# Patient Record
Sex: Female | Born: 1975 | Race: Black or African American | Hispanic: No | Marital: Single | Smoking: Current every day smoker
Health system: Southern US, Community
[De-identification: ages and names within clinical notes are randomized; demographics above are authoritative.]

## PROBLEM LIST (undated history)

## (undated) HISTORY — PX: CHOLECYSTECTOMY: SHX55

---

## 1999-11-01 ENCOUNTER — Emergency Department (HOSPITAL_COMMUNITY): Admission: EM | Admit: 1999-11-01 | Discharge: 1999-11-01 | Payer: Self-pay | Admitting: Emergency Medicine

## 1999-11-02 ENCOUNTER — Encounter: Payer: Self-pay | Admitting: Emergency Medicine

## 1999-11-24 ENCOUNTER — Observation Stay (HOSPITAL_COMMUNITY): Admission: RE | Admit: 1999-11-24 | Discharge: 1999-11-25 | Payer: Self-pay | Admitting: General Surgery

## 1999-11-24 ENCOUNTER — Encounter (INDEPENDENT_AMBULATORY_CARE_PROVIDER_SITE_OTHER): Payer: Self-pay | Admitting: Specialist

## 2000-06-28 ENCOUNTER — Inpatient Hospital Stay (HOSPITAL_COMMUNITY): Admission: AD | Admit: 2000-06-28 | Discharge: 2000-06-28 | Payer: Self-pay | Admitting: *Deleted

## 2000-06-29 ENCOUNTER — Encounter: Payer: Self-pay | Admitting: Obstetrics

## 2000-09-06 ENCOUNTER — Ambulatory Visit (HOSPITAL_COMMUNITY): Admission: RE | Admit: 2000-09-06 | Discharge: 2000-09-06 | Payer: Self-pay | Admitting: *Deleted

## 2000-11-29 ENCOUNTER — Encounter: Payer: Self-pay | Admitting: *Deleted

## 2000-11-29 ENCOUNTER — Ambulatory Visit (HOSPITAL_COMMUNITY): Admission: RE | Admit: 2000-11-29 | Discharge: 2000-11-29 | Payer: Self-pay | Admitting: *Deleted

## 2001-01-15 ENCOUNTER — Ambulatory Visit (HOSPITAL_COMMUNITY): Admission: RE | Admit: 2001-01-15 | Discharge: 2001-01-15 | Payer: Self-pay | Admitting: *Deleted

## 2001-01-25 ENCOUNTER — Inpatient Hospital Stay (HOSPITAL_COMMUNITY): Admission: AD | Admit: 2001-01-25 | Discharge: 2001-01-25 | Payer: Self-pay | Admitting: Obstetrics & Gynecology

## 2001-01-31 ENCOUNTER — Encounter (INDEPENDENT_AMBULATORY_CARE_PROVIDER_SITE_OTHER): Payer: Self-pay

## 2001-01-31 ENCOUNTER — Inpatient Hospital Stay (HOSPITAL_COMMUNITY): Admission: AD | Admit: 2001-01-31 | Discharge: 2001-02-04 | Payer: Self-pay | Admitting: *Deleted

## 2001-02-07 ENCOUNTER — Inpatient Hospital Stay (HOSPITAL_COMMUNITY): Admission: AD | Admit: 2001-02-07 | Discharge: 2001-02-07 | Payer: Self-pay | Admitting: Obstetrics

## 2002-11-19 ENCOUNTER — Encounter: Payer: Self-pay | Admitting: Emergency Medicine

## 2002-11-19 ENCOUNTER — Emergency Department (HOSPITAL_COMMUNITY): Admission: EM | Admit: 2002-11-19 | Discharge: 2002-11-19 | Payer: Self-pay | Admitting: Emergency Medicine

## 2005-03-21 ENCOUNTER — Ambulatory Visit: Payer: Self-pay | Admitting: Internal Medicine

## 2005-03-22 ENCOUNTER — Ambulatory Visit: Payer: Self-pay | Admitting: Internal Medicine

## 2005-06-13 ENCOUNTER — Ambulatory Visit: Payer: Self-pay | Admitting: Internal Medicine

## 2005-09-05 ENCOUNTER — Ambulatory Visit: Payer: Self-pay | Admitting: Internal Medicine

## 2005-09-07 ENCOUNTER — Emergency Department (HOSPITAL_COMMUNITY): Admission: EM | Admit: 2005-09-07 | Discharge: 2005-09-07 | Payer: Self-pay | Admitting: Emergency Medicine

## 2005-10-21 ENCOUNTER — Ambulatory Visit: Payer: Self-pay | Admitting: Family Medicine

## 2005-11-28 ENCOUNTER — Ambulatory Visit: Payer: Self-pay | Admitting: Family Medicine

## 2005-12-29 ENCOUNTER — Encounter: Payer: Self-pay | Admitting: Family Medicine

## 2005-12-29 ENCOUNTER — Ambulatory Visit: Payer: Self-pay | Admitting: Family Medicine

## 2006-02-20 ENCOUNTER — Ambulatory Visit: Payer: Self-pay | Admitting: Family Medicine

## 2006-05-15 ENCOUNTER — Ambulatory Visit: Payer: Self-pay | Admitting: Family Medicine

## 2006-08-07 ENCOUNTER — Ambulatory Visit: Payer: Self-pay | Admitting: Family Medicine

## 2006-09-29 ENCOUNTER — Emergency Department (HOSPITAL_COMMUNITY): Admission: EM | Admit: 2006-09-29 | Discharge: 2006-09-29 | Payer: Self-pay | Admitting: Emergency Medicine

## 2007-04-29 ENCOUNTER — Emergency Department (HOSPITAL_COMMUNITY): Admission: EM | Admit: 2007-04-29 | Discharge: 2007-04-29 | Payer: Self-pay | Admitting: Emergency Medicine

## 2011-01-14 NOTE — Op Note (Signed)
[facility]  Patient:    Brittany Woods, Brittany Woods                   MRN: [id number] Proc. Date: 02/01/01 Adm. Date:  [id number] Attending:  Michaelle Copas                           Operative Report  PREOPERATIVE DIAGNOSIS:       Arrest of descent.  POSTOPERATIVE DIAGNOSIS:      Arrest of descent.  OPERATION:                    Primary low transverse cesarean section.  SURGEON:                      Charles A. Clearance Coots, M.D.  ASSISTANT:                    Marlinda Mike, CNM  ANESTHESIA:                   Epidural.  ESTIMATED BLOOD LOSS:         1000 ml.  IV FLUIDS:                    1300 ml.  URINE OUT:                    300 ml, clear.  COMPLICATIONS:                None.  DRAINS:                       Foley to gravity.  FINDINGS:                     Viable female at 0203, Apgars 9 at 1 minute, 9 at 5 minutes, weight 8 pounds 7 ounces, normal uterus, ovaries, and fallopian tubes.  DESCRIPTION OF PROCEDURE:     The patient was brought to the operating room and, after satisfactory redosing of the epidural, the abdomen was prepped and draped in the usual sterile fashion.  Pfannenstiel skin incision was made with the scalpel that was deepened down to the fascia with the scalpel.  The fascia was nicked in the midline, and the fascial incision was extended to the left and to the right with curved Mayo scissors.  The superior and inferior fascial edges were taken off the rectus muscles with blunt and sharp dissection.  The rectus muscle was then sharply and bluntly divided in the midline superiorly and inferiorly, being careful to avoid the urinary bladder.  The peritoneum was entered digitally and was digitally extended to the left and to the right. The bladder blade was positioned, and the vesicouterine fold of the peritoneum above the reflection of the urinary bladder was grasped with forceps and was incised and undermined with Metzenbaum  scissors.  The incision was extended to the left and to the right with Metzenbaum scissors.  The bladder flap was bluntly developed, and the bladder blade was repositioned in front of the urinary bladder placing it well out of the operative field.  The uterus was entered in the lower uterine segment transversely with the scalpel.  The uterus incision was extended to the left and to the right with bandage scissors.  The vertex was noted to be left occiput posterior, and occiput was  then rotated into the incision, and there was still some difficulty flexing the occiput through the incision, and vacuum assistance was applied to the occiput with the Mityvac suction cup applied to the occiput, and the delivery was then completed with the aid of fundal pressure from the assistant and vacuum extraction.  The infants mouth and nose were suctioned with the suction bulb, and the delivery was then completed with the aid of fundal pressure from the assistant.  The umbilical cord was clamped and cut, and the infant was handed off to the nursery staff.  Cord blood was obtained, and the placenta was spontaneously expelled from the uterine cavity intact.  The edges of the uterine incision were grasped with ring forceps, and the uterus was closed with a continuous interlocking suture of 0 Monocryl from each corner to the center.  Hemostasis was excellent.  The pelvic cavity was then thoroughly irrigated with warm saline solution, and the closure of the uterus was again observed for hemostasis, and there was no active bleeding noted.  The abdomen was then closed as follows.  The peritoneum was closed with continuous suture of 2-0 Monocryl.  The fascia was closed with continuous suture of 0 Panacryl from each corner to the center.  Subcutaneous tissue was thoroughly irrigated with warm saline solution, and all areas of subcutaneous bleeding were coagulated with the Bovie.  Subcutaneous tissue was then  approximated with a continuous suture of 0 plain catgut.  Skin was approximated with stainless steel staples.  Sterile pressure bandage was applied to the incision closure. Surgical technician indicated that all needle, sponge, and instrument counts were correct.  The patient tolerated the procedure well and was transported to the recovery room in satisfactory condition. DD:  02/01/01 TD:  02/01/01 Job: [id number]

## 2011-01-14 NOTE — Discharge Summary (Signed)
[facility]  Patient:    Brittany Woods, Brittany Woods                   MRN: [id number] Adm. Date:  [id number] Disc. Date: [id number] Attending:  Tammi Sou Dictator:   Santiago Bumpers, M.D.                           Discharge Summary  ADMISSION DIAGNOSES:          A 38 week 4 day intrauterine pregnancy in early active labor.  DISCHARGE DIAGNOSES:          1. A 38 week 4 day intrauterine pregnancy in                                  early active labor.                               2. Chorioamnionitis.                               3. Cesarean delivery of a viable female infant.  DISCHARGE MEDICATIONS:        1. Percocet one to two tablets q.4-6h. p.r.n.                                  for pain.                               2. Ibuprofen one tablet q.6h. p.r.n. for pain.                               3. Prenatal vitamins one tablet q.d. for six                                  weeks (patient was given Depo-Provera IM                                  prior to discharge for contraception).  CONSULTS:                     None.  PROCEDURE:                    Low transverse cesarean section on February 01, 2001.  INDICATIONS:                  Arrest of descent.  FINDINGS:                     Viable female infant with Apgars of 9 at one minute and 9 at five minutes.  Weight 8 pounds 7 ounces.  Normal uterus, ovaries, and tubes were found.  Estimated blood loss was 1 L.  No complications.  HISTORY AND PHYSICAL:         For complete H&P please see resident H&P in chart.  Briefly, this was a 35 year old woman G2, P1-0-0-1 who  presented at 38 weeks 4 days gestation in early active labor.  She was afebrile.  Vital signs were stable.  Physical examination was within normal limits.  Cervix examination was 5 cm, 50%, -2, vertex presentation.  Fetal heart tracing was reactive and reassuring.  The patient was admitted for trial of labor and hospital course is as  follows.  HOSPITAL COURSE:              Patient continued to have intense uterine contractions, but repeated cervical examinations did not show a significant change in dilation or effacement.  She had internal uterine monitoring placed and was begun on Pitocin.  Even with adequate [place] units cervical examination did not change.  She did develop a low grade intrapartum fever and was begun on Unasyn.  Due to arrest of active phase of labor and low grade fever it was determined that she needed to be taken for low transverse cesarean delivery.  This was done and there were no complications (please see procedure part of dictation).  The patient tolerated the procedure well and postoperative course was uncomplicated.  She was afebrile.  Fundus was appropriately tender and involuted below the umbilicus.  Incision was clean, dry, and intact.  White blood cell count postoperatively was 18.1, hemoglobin 11.5.  She was continued on postoperative IV antibiotics until postoperative day #3.  On postoperative day #3 she was discharged to home with instructions to [facility].  She was also instructed to return to [facility] in the next several days for staple removal. DD:  03/15/01 TD:  03/15/01 Job: [id number]

## 2011-03-10 ENCOUNTER — Inpatient Hospital Stay (INDEPENDENT_AMBULATORY_CARE_PROVIDER_SITE_OTHER)
Admission: RE | Admit: 2011-03-10 | Discharge: 2011-03-10 | Disposition: A | Discharge status: Home / Self Care | Payer: Medicaid Other | Source: Ambulatory Visit | Attending: Family Medicine | Admitting: Family Medicine

## 2011-03-10 DIAGNOSIS — IMO0001 Reserved for inherently not codable concepts without codable children: Secondary | ICD-10-CM

## 2011-06-10 LAB — URINALYSIS, ROUTINE W REFLEX MICROSCOPIC
Bilirubin Urine: NEGATIVE
Glucose, UA: NEGATIVE
Ketones, ur: NEGATIVE
Nitrite: POSITIVE — AB
Specific Gravity, Urine: 1.012
pH: 6

## 2011-06-10 LAB — URINE MICROSCOPIC-ADD ON

## 2011-09-21 ENCOUNTER — Encounter (HOSPITAL_COMMUNITY): Payer: Self-pay | Admitting: Emergency Medicine

## 2011-09-21 ENCOUNTER — Emergency Department (INDEPENDENT_AMBULATORY_CARE_PROVIDER_SITE_OTHER)
Admission: EM | Admit: 2011-09-21 | Discharge: 2011-09-21 | Disposition: A | Discharge status: Home / Self Care | Payer: Self-pay | Source: Home / Self Care | Attending: Family Medicine | Admitting: Family Medicine

## 2011-09-21 DIAGNOSIS — L02419 Cutaneous abscess of limb, unspecified: Secondary | ICD-10-CM

## 2011-09-21 MED ORDER — PREDNISONE 20 MG PO TABS: 40.0000 mg | ORAL_TABLET | Freq: Every day | ORAL | Status: AC

## 2011-09-21 MED ORDER — CEPHALEXIN 500 MG PO CAPS: 500.0000 mg | ORAL_CAPSULE | Freq: Four times a day (QID) | ORAL | Status: AC

## 2011-09-21 NOTE — ED Notes (Signed)
Pt left leg started itching really bad starting last night. She states it started off looking like a pimple but it got progressively more itchy and swollen. She states this happened to her arm once before.

## 2011-09-21 NOTE — ED Provider Notes (Signed)
History     CSN: [id number]  Arrival date & time 09/21/11  1610   First MD Initiated Contact with Patient 09/21/11 1723      Chief Complaint  Patient presents with  . Pruritis    (Consider location/radiation/quality/duration/timing/severity/associated sxs/prior treatment) HPI Comments: Brittany Woods presents for examination of itching, redness, warmth, and swelling of her LEFT lower leg. She denies any specific injury, though she does report a "pimple" in the area. She remembers scratching it. She also reports a similar hx in her LEFT upper arm in the past.   Patient is a 36 y.o. female presenting with rash. The history is provided by the patient.  Rash  This is a new problem. The current episode started yesterday. The problem has not changed since onset.The problem is associated with an unknown factor. There has been no fever. The rash is present on the left lower leg. The pain is mild. Associated symptoms include itching. She has tried nothing for the symptoms.    History reviewed. No pertinent past medical history.  History reviewed. No pertinent past surgical history.  History reviewed. No pertinent family history.  History  Substance Use Topics  . Smoking status: Not on file  . Smokeless tobacco: Not on file  . Alcohol Use: Not on file    OB History    Grav Para Term Preterm Abortions TAB SAB Ect Mult Living                  Review of Systems  Constitutional: Negative.   HENT: Negative.   Eyes: Negative.   Respiratory: Negative.   Cardiovascular: Negative.   Gastrointestinal: Negative.   Genitourinary: Negative.   Musculoskeletal: Negative.   Skin: Positive for itching and rash.       Itching and warmth  Neurological: Negative.     Allergies  Review of patient's allergies indicates no known allergies.  Home Medications   Current Outpatient Rx  Name Route Sig Dispense Refill  . CEPHALEXIN 500 MG PO CAPS Oral Take 1 capsule (500 mg total) by mouth 4 (four)  times daily. 28 capsule 0  . PREDNISONE 20 MG PO TABS Oral Take 2 tablets (40 mg total) by mouth daily. 10 tablet 0    BP 138/81  Pulse 99  Temp(Src) 100 F (37.8 C) (Oral)  Resp 20  SpO2 100%  LMP 09/18/2011  Physical Exam  Nursing note and vitals reviewed. Constitutional: She is oriented to person, place, and time. She appears well-developed and well-nourished.  HENT:  Head: Normocephalic and atraumatic.  Eyes: EOM are normal.  Neck: Normal range of motion.  Pulmonary/Chest: Effort normal.  Musculoskeletal: Normal range of motion.       Left lower leg: She exhibits tenderness and swelling.       Legs: Neurological: She is alert and oriented to person, place, and time.  Skin: Skin is warm and dry.  Psychiatric: Her behavior is normal.    ED Course  Procedures (including critical care time)  Labs Reviewed - No data to display No results found.   1. Cellulitis and abscess of leg       MDM  Will treat for cellulitis and inflammation with antibiotics and prednisone; return in 48 hours for evaluation        Richardo Priest, MD 09/21/11 1859

## 2012-09-03 ENCOUNTER — Encounter (HOSPITAL_COMMUNITY): Payer: Self-pay | Admitting: *Deleted

## 2012-09-03 ENCOUNTER — Emergency Department (HOSPITAL_COMMUNITY)
Admission: EM | Admit: 2012-09-03 | Discharge: 2012-09-04 | Disposition: A | Discharge status: Home / Self Care | Payer: Medicaid Other | Attending: Emergency Medicine | Admitting: Emergency Medicine

## 2012-09-03 DIAGNOSIS — T63391A Toxic effect of venom of other spider, accidental (unintentional), initial encounter: Secondary | ICD-10-CM | POA: Insufficient documentation

## 2012-09-03 DIAGNOSIS — W57XXXA Bitten or stung by nonvenomous insect and other nonvenomous arthropods, initial encounter: Secondary | ICD-10-CM

## 2012-09-03 DIAGNOSIS — F17200 Nicotine dependence, unspecified, uncomplicated: Secondary | ICD-10-CM | POA: Insufficient documentation

## 2012-09-03 DIAGNOSIS — IMO0002 Reserved for concepts with insufficient information to code with codable children: Secondary | ICD-10-CM | POA: Insufficient documentation

## 2012-09-03 DIAGNOSIS — Y939 Activity, unspecified: Secondary | ICD-10-CM | POA: Insufficient documentation

## 2012-09-03 DIAGNOSIS — Y929 Unspecified place or not applicable: Secondary | ICD-10-CM | POA: Insufficient documentation

## 2012-09-03 DIAGNOSIS — L299 Pruritus, unspecified: Secondary | ICD-10-CM | POA: Insufficient documentation

## 2012-09-03 MED ORDER — CEPHALEXIN 500 MG PO CAPS: 500.0000 mg | ORAL_CAPSULE | Freq: Four times a day (QID) | ORAL | Status: DC

## 2012-09-03 MED ORDER — DEXAMETHASONE SODIUM PHOSPHATE 10 MG/ML IJ SOLN
10.0000 mg | Freq: Once | INTRAMUSCULAR | Status: AC
  Administered 2012-09-03: 10 mg via INTRAMUSCULAR
  Filled 2012-09-03: qty 1

## 2012-09-03 NOTE — ED Notes (Signed)
Pt states that her left eye started swelling yesterday. Pt states that she believes she was bite by something. Pt used warm rag to reduce swelling last night but throughout the day swelling continued. tp states also has new raised areas on her left forearm. Pt denies changes to daily routine.

## 2012-09-03 NOTE — ED Provider Notes (Signed)
History     CSN: [id number]  Arrival date & time 09/03/12  2223   First MD Initiated Contact with Patient 09/03/12 2305      Chief Complaint  Patient presents with  . Facial Swelling   HPI  History provided by the patient. Patient is a 37 year old female with no significant PMH who presents with concerns for insect bites and swelling to the face and arm. Patient states that symptoms first began yesterday with small areas of swelling and itching. Patient used some warm and cool rags over the areas with improvement. Symptoms however returned throughout the day today with some increased swelling and continued itching. Patient does admit to rubbing the areas regularly though she has been trying not to. Areas are located to the left eyebrow and left hand arm and elbow areas. Patient states that she thinks that she has had spider bites and has problems with small spiders in the house. She reports similar symptoms previously. She denies any other new environmental exposures. Denies any known allergic reactions. Denies any diffuse rash. Denies any difficulty breathing swallowing. Denies any fever, chills or sweats. Patient has not used any medications for this.    History reviewed. No pertinent past medical history.  Past Surgical History  Procedure Date  . Cholecystectomy   . Cesarean section 2002    History reviewed. No pertinent family history.  History  Substance Use Topics  . Smoking status: Current Every Day Smoker  . Smokeless tobacco: Not on file  . Alcohol Use: Yes    OB History    Grav Para Term Preterm Abortions TAB SAB Ect Mult Living                  Review of Systems  All other systems reviewed and are negative.    Allergies  Review of patient's allergies indicates no known allergies.  Home Medications  No current outpatient prescriptions on file.  BP 135/92  Pulse 95  Temp 98.5 F (36.9 C) (Oral)  Resp 16  SpO2 100%  Physical Exam  Nursing note and  vitals reviewed. Constitutional: She is oriented to person, place, and time. She appears well-developed and well-nourished. No distress.  HENT:  Head: Normocephalic.  Mouth/Throat: Oropharynx is clear and moist.  Neck: Normal range of motion. Neck supple.  Cardiovascular: Normal rate and regular rhythm.   Pulmonary/Chest: Effort normal and breath sounds normal. No stridor. No respiratory distress. She has no wheezes. She has no rales.  Neurological: She is alert and oriented to person, place, and time.  Skin: Skin is warm and dry. No rash noted.       Papular lesion to left eyebrow with surrounding erythema and edema extending into the upper eyelid. Similar papular lesions to left hand, forearm and elbow area with surrounding erythema. No other rash on skin.  Psychiatric: She has a normal mood and affect. Her behavior is normal.    ED Course  Procedures      1. Insect bite       MDM  11:20 PM patient seen and evaluated. Patient well-appearing in no acute distress. Symptoms consistent with insight bites and local surrounding reaction. At this time no concerns for secondary saline this infection.        Angus Seller, [place] 09/04/12 [phone]

## 2012-09-03 NOTE — ED Notes (Signed)
The pts lt eyelid is less swollen

## 2012-09-04 NOTE — ED Provider Notes (Signed)
Medical screening examination/treatment/procedure(s) were performed by non-physician practitioner and as supervising physician I was immediately available for consultation/collaboration.  Olivia Mackie, MD 09/04/12 [phone]

## 2013-12-14 ENCOUNTER — Other Ambulatory Visit (HOSPITAL_COMMUNITY)
Admission: RE | Admit: 2013-12-14 | Discharge: 2013-12-14 | Disposition: A | Discharge status: Home / Self Care | Payer: BC Managed Care – PPO | Source: Ambulatory Visit | Attending: Emergency Medicine | Admitting: Emergency Medicine

## 2013-12-14 ENCOUNTER — Encounter (HOSPITAL_COMMUNITY): Payer: Self-pay | Admitting: Emergency Medicine

## 2013-12-14 ENCOUNTER — Emergency Department (INDEPENDENT_AMBULATORY_CARE_PROVIDER_SITE_OTHER)
Admission: EM | Admit: 2013-12-14 | Discharge: 2013-12-14 | Disposition: A | Discharge status: Home / Self Care | Payer: BC Managed Care – PPO | Source: Home / Self Care | Attending: Emergency Medicine | Admitting: Emergency Medicine

## 2013-12-14 DIAGNOSIS — Z113 Encounter for screening for infections with a predominantly sexual mode of transmission: Secondary | ICD-10-CM | POA: Insufficient documentation

## 2013-12-14 DIAGNOSIS — N760 Acute vaginitis: Secondary | ICD-10-CM | POA: Insufficient documentation

## 2013-12-14 LAB — POCT URINALYSIS DIP (DEVICE)
Bilirubin Urine: NEGATIVE
Glucose, UA: NEGATIVE mg/dL
Ketones, ur: NEGATIVE mg/dL
Nitrite: POSITIVE — AB
PROTEIN: NEGATIVE mg/dL
Specific Gravity, Urine: 1.02 (ref 1.005–1.030)
UROBILINOGEN UA: 1 mg/dL (ref 0.0–1.0)
pH: 7 (ref 5.0–8.0)

## 2013-12-14 LAB — POCT PREGNANCY, URINE: PREG TEST UR: NEGATIVE

## 2013-12-14 MED ORDER — METRONIDAZOLE 500 MG PO TABS: 500.0000 mg | ORAL_TABLET | Freq: Two times a day (BID) | ORAL | Status: DC

## 2013-12-14 MED ORDER — FLUCONAZOLE 150 MG PO TABS: 150.0000 mg | ORAL_TABLET | Freq: Once | ORAL | Status: DC

## 2013-12-14 NOTE — Discharge Instructions (Signed)
To restore the normal balance of "good bacteria" in your system.  Take a probiotic once daily.  These can be gotten over the counter at the drug store without a prescription and come under various brand names such as Culturelle, Align, Florastore, and [employer].  The best thing to do is to ask your pharmacist to recommend a good probiotic that is not too expensive.     Bacterial Vaginosis Bacterial vaginosis is a vaginal infection that occurs when the normal balance of bacteria in the vagina is disrupted. It results from an overgrowth of certain bacteria. This is the most common vaginal infection in women of childbearing age. Treatment is important to prevent complications, especially in pregnant women, as it can cause a premature delivery. CAUSES  Bacterial vaginosis is caused by an increase in harmful bacteria that are normally present in smaller amounts in the vagina. Several different kinds of bacteria can cause bacterial vaginosis. However, the reason that the condition develops is not fully understood. RISK FACTORS Certain activities or behaviors can put you at an increased risk of developing bacterial vaginosis, including:  Having a new sex partner or multiple sex partners.  Douching.  Using an intrauterine device (IUD) for contraception. Women do not get bacterial vaginosis from toilet seats, bedding, swimming pools, or contact with objects around them. SIGNS AND SYMPTOMS  Some women with bacterial vaginosis have no signs or symptoms. Common symptoms include:  Grey vaginal discharge.  A fishlike odor with discharge, especially after sexual intercourse.  Itching or burning of the vagina and vulva.  Burning or pain with urination. DIAGNOSIS  Your health care provider will take a medical history and examine the vagina for signs of bacterial vaginosis. A sample of vaginal fluid may be taken. Your health care provider will look at this sample under a microscope to check for bacteria and  abnormal cells. A vaginal pH test may also be done.  TREATMENT  Bacterial vaginosis may be treated with antibiotic medicines. These may be given in the form of a pill or a vaginal cream. A second round of antibiotics may be prescribed if the condition comes back after treatment.  HOME CARE INSTRUCTIONS   Only take over-the-counter or prescription medicines as directed by your health care provider.  If antibiotic medicine was prescribed, take it as directed. Make sure you finish it even if you start to feel better.  Do not have sex until treatment is completed.  Tell all sexual partners that you have a vaginal infection. They should see their health care provider and be treated if they have problems, such as a mild rash or itching.  Practice safe sex by using condoms and only having one sex partner. SEEK MEDICAL CARE IF:   Your symptoms are not improving after 3 days of treatment.  You have increased discharge or pain.  You have a fever. MAKE SURE YOU:   Understand these instructions.  Will watch your condition.  Will get help right away if you are not doing well or get worse. FOR MORE INFORMATION  Centers for Disease Control and Prevention, Division of STD Prevention: SolutionApps.co.zawww.cdc.gov/std American Sexual Health Association (ASHA): www.ashastd.org  Document Released: 08/15/2005 Document Revised: 06/05/2013 Document Reviewed: 03/27/2013 [facility] Patient Information 2014 [place], [place]. Candidal Vulvovaginitis Candidal vulvovaginitis is an infection of the vagina and vulva. The vulva is the skin around the opening of the vagina. This may cause itching and discomfort in and around the vagina.  HOME CARE  Only take medicine as told by  your doctor.  Do not have sex (intercourse) until the infection is healed or as told by your doctor.  Practice safe sex.  Tell your sex partner about your infection.  Do not douche or use tampons.  Wear cotton underwear. Do not wear tight pants or  panty hose.  Eat yogurt. This may help treat and prevent yeast infections. GET HELP RIGHT AWAY IF:   You have a fever.  Your problems get worse during treatment or do not get better in 3 days.  You have discomfort, irritation, or itching in your vagina or vulva area.  You have pain after sex.  You start to get belly (abdominal) pain. MAKE SURE YOU:  Understand these instructions.  Will watch your condition.  Will get help right away if you are not doing well or get worse. Document Released: 11/11/2008 Document Revised: 11/07/2011 Document Reviewed: 11/11/2008 [facility] Patient Information 2014 [place], [place].

## 2013-12-14 NOTE — ED Provider Notes (Signed)
Chief Complaint   Chief Complaint  Patient presents with  . Vaginal Itching    History of Present Illness   Brittany Woods is a 38 year old female who has had a two-day history of vaginal irritation. She denies any discharge, odor, or itching. She has had no vaginal, vulvar, or pelvic pain. She denies any urinary symptoms. No fever, chills, nausea, or vomiting. Her menses have been regular. She is on Depo-Provera. She has a history of atrial vaginosis in the past.  Review of Systems   Other than as noted above, the patient denies any of the following symptoms: Systemic:  No fever or chills GI:  No abdominal pain, nausea, vomiting, diarrhea, constipation, melena or hematochezia. GU:  No dysuria, frequency, urgency, hematuria, vaginal discharge, itching, or abnormal vaginal bleeding.  PMFSH   Past medical history, family history, social history, meds, and allergies were reviewed.    Physical Examination    Vital signs:  BP 146/85  Pulse 85  Temp(Src) 98.4 F (36.9 C) (Oral)  Resp 17  SpO2 100% General:  Alert, oriented and in no distress. Lungs:  Breath sounds clear and equal bilaterally.  No wheezes, rales or rhonchi. Heart:  Regular rhythm.  No gallops or murmers. Abdomen:  Soft, flat and non-distended.  No organomegaly or mass.  No tenderness, guarding or rebound.  Bowel sounds normally active. Pelvic exam:  Normal external genitalia. No ulcerations or lesions. Vaginal and cervical mucosa were normal. There was a moderate amount of malodorous vaginal discharge. No pain on cervical motion. Uterus was normal in size and shape and nontender. No adnexal masses or tenderness.  DNA probes for gonorrhea, Chlamydia, Trichomonas, Gardnerella, Candida were obtained. Skin:  Clear, warm and dry.  Labs   Results for orders placed during the hospital encounter of 12/14/13  POCT URINALYSIS DIP (DEVICE)      Result Value Ref Range   Glucose, UA NEGATIVE  NEGATIVE mg/dL   Bilirubin  Urine NEGATIVE  NEGATIVE   Ketones, ur NEGATIVE  NEGATIVE mg/dL   Specific Gravity, Urine 1.020  1.005 - 1.030   Hgb urine dipstick TRACE (*) NEGATIVE   pH 7.0  5.0 - 8.0   Protein, ur NEGATIVE  NEGATIVE mg/dL   Urobilinogen, UA 1.0  0.0 - 1.0 mg/dL   Nitrite POSITIVE (*) NEGATIVE   Leukocytes, UA SMALL (*) NEGATIVE  POCT PREGNANCY, URINE      Result Value Ref Range   Preg Test, Ur NEGATIVE  NEGATIVE    Assessment   The encounter diagnosis was Vaginitis.  Probably Candida or bacterial vaginosis.       Plan    1.  Meds:  The following meds were prescribed:   Discharge Medication List as of 12/14/2013  4:00 PM    START taking these medications   Details  fluconazole (DIFLUCAN) 150 MG tablet Take 1 tablet (150 mg total) by mouth once., Starting 12/14/2013, Normal    metroNIDAZOLE (FLAGYL) 500 MG tablet Take 1 tablet (500 mg total) by mouth 2 (two) times daily., Starting 12/14/2013, Until Discontinued, Normal        2.  Patient Education/Counseling:  The patient was given appropriate handouts, self care instructions, and instructed in symptomatic relief.  Suggested probiotics for prevention of both Candida and bacterial vaginosis.  3.  Follow up:  The patient was told to follow up here if no better in 3 to 4 days, or sooner if becoming worse in any way, and given some red flag symptoms such as  worsening pain, fever, persistent vomiting, or heavy vaginal bleeding which would prompt immediate return.       Reuben Likesavid C Lavontay Kirk, MD 12/14/13 2039

## 2013-12-14 NOTE — ED Notes (Signed)
Pt  Reports  Symptoms  Of  Vaginal  Irritation      Discomfort  That  She  Noticed  Last  Pm   denys  Any  Discharge /  Bleeding

## 2013-12-16 LAB — CERVICOVAGINAL ANCILLARY ONLY
Chlamydia: NEGATIVE
NEISSERIA GONORRHEA: NEGATIVE
Wet Prep (BD Affirm): NEGATIVE
Wet Prep (BD Affirm): NEGATIVE
Wet Prep (BD Affirm): POSITIVE — AB

## 2013-12-17 NOTE — ED Notes (Signed)
GC/Chlamydia neg., Affirm: Candida and Trich neg. Gardnerella pos.  Pt. adequately treated with Flagyl. Desiree LucySuzanne M [facility] 12/17/2013

## 2015-04-19 ENCOUNTER — Other Ambulatory Visit (HOSPITAL_COMMUNITY)
Admission: RE | Admit: 2015-04-19 | Discharge: 2015-04-19 | Disposition: A | Discharge status: Home / Self Care | Payer: Medicaid Other | Source: Ambulatory Visit | Attending: Emergency Medicine | Admitting: Emergency Medicine

## 2015-04-19 ENCOUNTER — Encounter (HOSPITAL_COMMUNITY): Payer: Self-pay | Admitting: *Deleted

## 2015-04-19 ENCOUNTER — Emergency Department (INDEPENDENT_AMBULATORY_CARE_PROVIDER_SITE_OTHER)
Admission: EM | Admit: 2015-04-19 | Discharge: 2015-04-19 | Disposition: A | Discharge status: Home / Self Care | Payer: Self-pay | Source: Home / Self Care | Attending: Emergency Medicine | Admitting: Emergency Medicine

## 2015-04-19 DIAGNOSIS — N898 Other specified noninflammatory disorders of vagina: Secondary | ICD-10-CM

## 2015-04-19 DIAGNOSIS — N760 Acute vaginitis: Secondary | ICD-10-CM | POA: Diagnosis present

## 2015-04-19 DIAGNOSIS — Z113 Encounter for screening for infections with a predominantly sexual mode of transmission: Secondary | ICD-10-CM | POA: Diagnosis not present

## 2015-04-19 MED ORDER — FLUCONAZOLE 150 MG PO TABS: ORAL_TABLET | ORAL | Status: DC

## 2015-04-19 MED ORDER — METRONIDAZOLE 500 MG PO TABS: 500.0000 mg | ORAL_TABLET | Freq: Two times a day (BID) | ORAL | Status: DC

## 2015-04-19 NOTE — Discharge Instructions (Signed)
Bacterial Vaginosis Bacterial vaginosis is a vaginal infection that occurs when the normal balance of bacteria in the vagina is disrupted. It results from an overgrowth of certain bacteria. This is the most common vaginal infection in women of childbearing age. Treatment is important to prevent complications, especially in pregnant women, as it can cause a premature delivery. CAUSES  Bacterial vaginosis is caused by an increase in harmful bacteria that are normally present in smaller amounts in the vagina. Several different kinds of bacteria can cause bacterial vaginosis. However, the reason that the condition develops is not fully understood. RISK FACTORS Certain activities or behaviors can put you at an increased risk of developing bacterial vaginosis, including:  Having a new sex partner or multiple sex partners.  Douching.  Using an intrauterine device (IUD) for contraception. Women do not get bacterial vaginosis from toilet seats, bedding, swimming pools, or contact with objects around them. SIGNS AND SYMPTOMS  Some women with bacterial vaginosis have no signs or symptoms. Common symptoms include:  Grey vaginal discharge.  A fishlike odor with discharge, especially after sexual intercourse.  Itching or burning of the vagina and vulva.  Burning or pain with urination. DIAGNOSIS  Your health care provider will take a medical history and examine the vagina for signs of bacterial vaginosis. A sample of vaginal fluid may be taken. Your health care provider will look at this sample under a microscope to check for bacteria and abnormal cells. A vaginal pH test may also be done.  TREATMENT  Bacterial vaginosis may be treated with antibiotic medicines. These may be given in the form of a pill or a vaginal cream. A second round of antibiotics may be prescribed if the condition comes back after treatment.  HOME CARE INSTRUCTIONS   Only take over-the-counter or prescription medicines as  directed by your health care provider.  If antibiotic medicine was prescribed, take it as directed. Make sure you finish it even if you start to feel better.  Do not have sex until treatment is completed.  Tell all sexual partners that you have a vaginal infection. They should see their health care provider and be treated if they have problems, such as a mild rash or itching.  Practice safe sex by using condoms and only having one sex partner. SEEK MEDICAL CARE IF:   Your symptoms are not improving after 3 days of treatment.  You have increased discharge or pain.  You have a fever. MAKE SURE YOU:   Understand these instructions.  Will watch your condition.  Will get help right away if you are not doing well or get worse. FOR MORE INFORMATION  Centers for Disease Control and Prevention, Division of STD Prevention: SolutionApps.co.za American Sexual Health Association (ASHA): www.ashastd.org  Document Released: 08/15/2005 Document Revised: 06/05/2013 Document Reviewed: 03/27/2013 [facility] Patient Information 2015 [place], [place]. This information is not intended to replace advice given to you by your health care provider. Make sure you discuss any questions you have with your health care provider.  Vaginitis May apply Cortisone 1% twice a day to the outside area of irritation for up to 5 days only. Vaginitis is an inflammation of the vagina. It is most often caused by a change in the normal balance of the bacteria and yeast that live in the vagina. This change in balance causes an overgrowth of certain bacteria or yeast, which causes the inflammation. There are different types of vaginitis, but the most common types are:  Bacterial vaginosis.  Yeast infection (candidiasis).  Trichomoniasis vaginitis. This is a sexually transmitted infection (STI).  Viral vaginitis.  Atropic vaginitis.  Allergic vaginitis. CAUSES  The cause depends on the type of vaginitis. Vaginitis can be  caused by:  Bacteria (bacterial vaginosis).  Yeast (yeast infection).  A parasite (trichomoniasis vaginitis)  A virus (viral vaginitis).  Low hormone levels (atrophic vaginitis). Low hormone levels can occur during pregnancy, breastfeeding, or after menopause.  Irritants, such as bubble baths, scented tampons, and feminine sprays (allergic vaginitis). Other factors can change the normal balance of the yeast and bacteria that live in the vagina. These include:  Antibiotic medicines.  Poor hygiene.  Diaphragms, vaginal sponges, spermicides, birth control pills, and intrauterine devices (IUD).  Sexual intercourse.  Infection.  Uncontrolled diabetes.  A weakened immune system. SYMPTOMS  Symptoms can vary depending on the cause of the vaginitis. Common symptoms include:  Abnormal vaginal discharge.  The discharge is white, gray, or yellow with bacterial vaginosis.  The discharge is thick, white, and cheesy with a yeast infection.  The discharge is frothy and yellow or greenish with trichomoniasis.  A bad vaginal odor.  The odor is fishy with bacterial vaginosis.  Vaginal itching, pain, or swelling.  Painful intercourse.  Pain or burning when urinating. Sometimes, there are no symptoms. TREATMENT  Treatment will vary depending on the type of infection.   Bacterial vaginosis and trichomoniasis are often treated with antibiotic creams or pills.  Yeast infections are often treated with antifungal medicines, such as vaginal creams or suppositories.  Viral vaginitis has no cure, but symptoms can be treated with medicines that relieve discomfort. Your sexual partner should be treated as well.  Atrophic vaginitis may be treated with an estrogen cream, pill, suppository, or vaginal ring. If vaginal dryness occurs, lubricants and moisturizing creams may help. You may be told to avoid scented soaps, sprays, or douches.  Allergic vaginitis treatment involves quitting the  use of the product that is causing the problem. Vaginal creams can be used to treat the symptoms. HOME CARE INSTRUCTIONS   Take all medicines as directed by your caregiver.  Keep your genital area clean and dry. Avoid soap and only rinse the area with water.  Avoid douching. It can remove the healthy bacteria in the vagina.  Do not use tampons or have sexual intercourse until your vaginitis has been treated. Use sanitary pads while you have vaginitis.  Wipe from front to back. This avoids the spread of bacteria from the rectum to the vagina.  Let air reach your genital area.  Wear cotton underwear to decrease moisture buildup.  Avoid wearing underwear while you sleep until your vaginitis is gone.  Avoid tight pants and underwear or nylons without a cotton panel.  Take off wet clothing (especially bathing suits) as soon as possible.  Use mild, non-scented products. Avoid using irritants, such as:  Scented feminine sprays.  Fabric softeners.  Scented detergents.  Scented tampons.  Scented soaps or bubble baths.  Practice safe sex and use condoms. Condoms may prevent the spread of trichomoniasis and viral vaginitis. SEEK MEDICAL CARE IF:   You have abdominal pain.  You have a fever or persistent symptoms for more than 2-3 days.  You have a fever and your symptoms suddenly get worse. Document Released: 06/12/2007 Document Revised: 05/09/2012 Document Reviewed: 01/26/2012 [facility] Patient Information 2015 [place], [place]. This information is not intended to replace advice given to you by your health care provider. Make sure you discuss any questions you have with your health care  provider.

## 2015-04-19 NOTE — ED Provider Notes (Signed)
CSN: [id number]     Arrival date & time 04/19/15  1410 History   First MD Initiated Contact with Patient 04/19/15 1637     Chief Complaint  Patient presents with  . Vaginal Itching   (Consider location/radiation/quality/duration/timing/severity/associated sxs/prior Treatment) HPI Comments: 39 year old female complaining of vaginal irritation for 4 days. She has been using a new soap for 4 days and believes that this is causing the burning and irritation particularly moments after using a product. She has since stopped using this particular so. She denies a significant discharge. But there has been some white discoloration of vaginal drainage. Denies all urinary symptoms. Denies pelvic pain.  Patient is a 39 y.o. female presenting with vaginal itching and vaginal discharge.  Vaginal Itching Pertinent negatives include no abdominal pain.  Vaginal Discharge Quality:  White Severity:  Mild Duration:  4 days Timing:  Constant Progression:  Worsening Chronicity:  New Context: not after urination, not during urination, not genital trauma and not recent antibiotic use   Relieved by:  Nothing Ineffective treatments:  OTC medications Associated symptoms: rash and vaginal itching   Associated symptoms: no abdominal pain, no dysuria, no fever, no nausea, no urinary frequency, no urinary hesitancy and no vomiting   Risk factors: no foreign body, no PID and no STI exposure     History reviewed. No pertinent past medical history. Past Surgical History  Procedure Laterality Date  . Cholecystectomy    . Cesarean section  2002   History reviewed. No pertinent family history. Social History  Substance Use Topics  . Smoking status: Current Every Day Smoker  . Smokeless tobacco: None  . Alcohol Use: Yes   OB History    No data available     Review of Systems  Constitutional: Negative.  Negative for fever.  Gastrointestinal: Negative for nausea, vomiting and abdominal pain.  Genitourinary:  Positive for hematuria, vaginal discharge and vaginal pain. Negative for dysuria, hesitancy, urgency, frequency, flank pain, decreased urine volume, vaginal bleeding, menstrual problem and pelvic pain.  Musculoskeletal: Negative.   Skin: Negative.     Allergies  Review of patient's allergies indicates no known allergies.  Home Medications   Prior to Admission medications   Medication Sig Start Date End Date Taking? Authorizing Provider  fluconazole (DIFLUCAN) 150 MG tablet Take 1 tablet every other day. 04/19/15   Hayden Rasmussen, NP  metroNIDAZOLE (FLAGYL) 500 MG tablet Take 1 tablet (500 mg total) by mouth 2 (two) times daily. X 7 days 04/19/15   Hayden Rasmussen, NP   BP 125/84 mmHg  Pulse 91  Temp(Src) 99.1 F (37.3 C) (Oral)  Resp 18  SpO2 100% Physical Exam  Constitutional: She is oriented to person, place, and time. She appears well-developed and well-nourished. No distress.  Neck: Normal range of motion. Neck supple.  Cardiovascular: Normal rate.   Pulmonary/Chest: Effort normal. No respiratory distress.  Genitourinary: Vaginal discharge found.  Normal external female genitalia anatomy. There is erythema to the mucosal aspect of the labia minora. There is a small amount of thick white vaginal discharge to the external labia and introitus. After insertion of the speculum there is evidence of a moderate to large amount of thick white vaginal discharge that had to be cleared to visualize the cervix. Ectocervix is slightly pale. No erythema or lesions. No cervical tenderness.  Musculoskeletal: She exhibits no edema.  Neurological: She is alert and oriented to person, place, and time. She exhibits normal muscle tone.  Skin: Skin is warm and dry.  Psychiatric: She has a normal mood and affect.  Nursing note and vitals reviewed.   ED Course  Procedures (including critical care time) Labs Review Labs Reviewed - No data to display  Imaging Review No results found.   MDM   1. Acute  vulvitis or vulvovaginitis   2. Vaginal discharge   [place] , [place] present during exam. May apply Cortisone 1% twice a day to the outside area of irritation for up to 5 days only. Flagyl x 7 d Diflucan 150 q o d x 3 doses. With the large amt of discharge associated with using a scented soap to the vulva, unable to discern more specifically the etiology. Wil tx for poss BV and candida and contact dermatitis. Stop using the soap.    Hayden Rasmussen, NP 04/19/15 1702  Hayden Rasmussen, NP 04/19/15 1705

## 2015-04-19 NOTE — ED Notes (Signed)
Pt   Reports    Vaginal  Irritation     X  5  Days   denys  Any  Discharge   denys  Any bleeding     Pt  Thinks  It  May  Be  From  A  New  deoderant  Soap

## 2015-04-20 LAB — CERVICOVAGINAL ANCILLARY ONLY
CHLAMYDIA, DNA PROBE: NEGATIVE
NEISSERIA GONORRHEA: NEGATIVE

## 2015-04-20 NOTE — ED Notes (Signed)
Final report of GC chlamydia negative. Still waiting for final report of wet prep

## 2015-04-21 LAB — CERVICOVAGINAL ANCILLARY ONLY: Wet Prep (BD Affirm): POSITIVE — AB

## 2015-04-21 NOTE — ED Notes (Signed)
Final report of wet prep positive for gardnerella, treatment adequate w Flagyl. Called and left detailed message on patient secure VM, and to call back if any issues

## 2016-02-23 ENCOUNTER — Ambulatory Visit (HOSPITAL_COMMUNITY)
Admission: EM | Admit: 2016-02-23 | Discharge: 2016-02-23 | Disposition: A | Discharge status: Home / Self Care | Payer: Medicaid Other | Attending: Family Medicine | Admitting: Family Medicine

## 2016-02-23 ENCOUNTER — Encounter (HOSPITAL_COMMUNITY): Payer: Self-pay | Admitting: Emergency Medicine

## 2016-02-23 DIAGNOSIS — L298 Other pruritus: Secondary | ICD-10-CM | POA: Insufficient documentation

## 2016-02-23 DIAGNOSIS — N898 Other specified noninflammatory disorders of vagina: Secondary | ICD-10-CM

## 2016-02-23 DIAGNOSIS — B9689 Other specified bacterial agents as the cause of diseases classified elsewhere: Secondary | ICD-10-CM

## 2016-02-23 DIAGNOSIS — N760 Acute vaginitis: Secondary | ICD-10-CM | POA: Insufficient documentation

## 2016-02-23 DIAGNOSIS — A499 Bacterial infection, unspecified: Secondary | ICD-10-CM

## 2016-02-23 DIAGNOSIS — F17200 Nicotine dependence, unspecified, uncomplicated: Secondary | ICD-10-CM | POA: Insufficient documentation

## 2016-02-23 DIAGNOSIS — Z79899 Other long term (current) drug therapy: Secondary | ICD-10-CM | POA: Insufficient documentation

## 2016-02-23 DIAGNOSIS — Z9889 Other specified postprocedural states: Secondary | ICD-10-CM | POA: Insufficient documentation

## 2016-02-23 DIAGNOSIS — Z9049 Acquired absence of other specified parts of digestive tract: Secondary | ICD-10-CM | POA: Insufficient documentation

## 2016-02-23 MED ORDER — FLUCONAZOLE 200 MG PO TABS: 200.0000 mg | ORAL_TABLET | Freq: Every day | ORAL | Status: AC

## 2016-02-23 MED ORDER — METRONIDAZOLE 500 MG PO TABS: 500.0000 mg | ORAL_TABLET | Freq: Two times a day (BID) | ORAL | Status: DC

## 2016-02-23 MED ORDER — HYDROCORTISONE 1 % EX CREA: TOPICAL_CREAM | CUTANEOUS | Status: DC

## 2016-02-23 NOTE — ED Provider Notes (Signed)
CSN: [id number]     Arrival date & time 02/23/16  1742 History   First MD Initiated Contact with Patient 02/23/16 1847     Chief Complaint  Patient presents with  . Vaginal Itching   (Consider location/radiation/quality/duration/timing/severity/associated sxs/prior Treatment) HPI History obtained from patient: Location: Vagina  Context/Duration: 1 Week, sudden onset  Severity: 2-3  Quality: Irritation Timing:       Constant     Home Treatment: Soaking in tub Associated symptoms:  Thick malodorous discharge Family History: Hypertension    History reviewed. No pertinent past medical history. Past Surgical History  Procedure Laterality Date  . Cholecystectomy    . Cesarean section  2002   History reviewed. No pertinent family history. Social History  Substance Use Topics  . Smoking status: Current Every Day Smoker  . Smokeless tobacco: None  . Alcohol Use: Yes   OB History    No data available     Review of Systems  Denies: HEADACHE, NAUSEA, ABDOMINAL PAIN, CHEST PAIN, CONGESTION, DYSURIA, SHORTNESS OF BREATH  Allergies  Review of patient's allergies indicates no known allergies.  Home Medications   Prior to Admission medications   Medication Sig Start Date End Date Taking? Authorizing Provider  fluconazole (DIFLUCAN) 150 MG tablet Take 1 tablet every other day. 04/19/15   Hayden Rasmussenavid Mabe, NP  fluconazole (DIFLUCAN) 200 MG tablet Take 1 tablet (200 mg total) by mouth daily. 02/23/16 03/01/16  Tharon AquasFrank C Becker Christopher, PA  hydrocortisone cream 1 % Apply to affected area 2 times daily 02/23/16   Tharon AquasFrank C Itzae Mccurdy, PA  metroNIDAZOLE (FLAGYL) 500 MG tablet Take 1 tablet (500 mg total) by mouth 2 (two) times daily. X 7 days 04/19/15   Hayden Rasmussenavid Mabe, NP  metroNIDAZOLE (FLAGYL) 500 MG tablet Take 1 tablet (500 mg total) by mouth 2 (two) times daily. 02/23/16   Tharon AquasFrank C Ameet Sandy, PA   Meds Ordered and Administered this Visit  Medications - No data to display  There were no vitals taken for this  visit. No data found.   Physical Exam NURSES NOTES AND VITAL SIGNS REVIEWED. CONSTITUTIONAL: Well developed, well nourished, no acute distress HEENT: normocephalic, atraumatic EYES: Conjunctiva normal NECK:normal ROM, supple, no adenopathy PULMONARY:No respiratory distress, normal effort ABDOMINAL: Soft, ND, NT BS+, No CVAT MUSCULOSKELETAL: Normal ROM of all extremities,  SKIN: warm and dry without rash PSYCHIATRIC: Mood and affect, behavior are normal NURSES NOTES AND VITAL SIGNS REVIEWED. CONSTITUTIONAL: Well developed, well nourished, no acute distress HEENT: normocephalic, atraumatic, right and left TM's are normal EYES: Conjunctiva normal NECK:normal ROM, supple, no adenopathy PULMONARY:No respiratory distress, normal effort Exam is performed with patient's permission Female nursing staff present to chaperone.  Perineum: clean, dry without lesions, no groin or inguinal there is mild redness noted.  . No caruncle or prolapse noted.  Vaginal canal: moderate amount thick white adherent discharge noted in canal with loss of rugae. Copious amount thick homogenous white-yellow discharge in fornix. Malodorous discharge  Cervix is pink and non-friable.    Bi-manual: no pain on palpation of cervix or adnexal structures, ovaries not enlarged.   ED Course  Procedures (including critical care time)  Labs Review Labs Reviewed  CERVICOVAGINAL ANCILLARY ONLY    Imaging Review No results found.   Visual Acuity Review  Right Eye Distance:   Left Eye Distance:   Bilateral Distance:    Right Eye Near:   Left Eye Near:    Bilateral Near:     RX Metronidazole and Diflucan, cortisone cream  MDM   1. BV (bacterial vaginosis)   2. Vaginal irritation     Patient is reassured that there are no issues that require transfer to higher level of care at this time or additional tests. Patient is advised to continue home symptomatic treatment. Patient is advised that if there are  new or worsening symptoms to attend the emergency department, contact primary care provider, or return to UC. Instructions of care provided discharged home in stable condition.    THIS NOTE WAS GENERATED USING A VOICE RECOGNITION SOFTWARE PROGRAM. ALL REASONABLE EFFORTS  WERE MADE TO PROOFREAD THIS DOCUMENT FOR ACCURACY.  I have verbally reviewed the discharge instructions with the patient. A printed AVS was given to the patient.  All questions were answered prior to discharge.      Tharon AquasFrank C Mathilde Mcwherter, PA 02/23/16 1939

## 2016-02-23 NOTE — ED Notes (Signed)
Vaginal itching and irritation.  Symptoms for a week.  [facility] treated for [facility]v, reports other tests normal.  Patient reports taking flagyl as directed. Reports symptoms resolved.  6/14 went to clinic for depot shot.  Following this, symptoms started again of itching, irritation.  Low back pain, low abdominal pain

## 2016-02-23 NOTE — Discharge Instructions (Signed)

## 2016-02-24 LAB — CERVICOVAGINAL ANCILLARY ONLY
CHLAMYDIA, DNA PROBE: NEGATIVE
Neisseria Gonorrhea: NEGATIVE
Wet Prep (BD Affirm): POSITIVE — AB

## 2016-08-25 ENCOUNTER — Encounter (HOSPITAL_COMMUNITY): Payer: Self-pay | Admitting: Emergency Medicine

## 2016-08-25 ENCOUNTER — Emergency Department (HOSPITAL_COMMUNITY)
Admission: EM | Admit: 2016-08-25 | Discharge: 2016-08-26 | Disposition: A | Discharge status: Home / Self Care | Payer: Self-pay | Attending: Emergency Medicine | Admitting: Emergency Medicine

## 2016-08-25 DIAGNOSIS — Z79899 Other long term (current) drug therapy: Secondary | ICD-10-CM | POA: Insufficient documentation

## 2016-08-25 DIAGNOSIS — R1030 Lower abdominal pain, unspecified: Secondary | ICD-10-CM

## 2016-08-25 DIAGNOSIS — N760 Acute vaginitis: Secondary | ICD-10-CM | POA: Insufficient documentation

## 2016-08-25 DIAGNOSIS — F17200 Nicotine dependence, unspecified, uncomplicated: Secondary | ICD-10-CM | POA: Insufficient documentation

## 2016-08-25 DIAGNOSIS — B9689 Other specified bacterial agents as the cause of diseases classified elsewhere: Secondary | ICD-10-CM | POA: Insufficient documentation

## 2016-08-25 LAB — URINALYSIS, ROUTINE W REFLEX MICROSCOPIC
Bilirubin Urine: NEGATIVE
Glucose, UA: NEGATIVE mg/dL
KETONES UR: NEGATIVE mg/dL
Nitrite: NEGATIVE
PROTEIN: 30 mg/dL — AB
Specific Gravity, Urine: 1.014 (ref 1.005–1.030)
pH: 6 (ref 5.0–8.0)

## 2016-08-25 LAB — PREGNANCY, URINE: Preg Test, Ur: NEGATIVE

## 2016-08-25 NOTE — ED Triage Notes (Signed)
Pt is c/o lower abd pain and lower back pain that started on Friday  Pt states she thinks it may be BV  Pt states her legs are aching  Pt states she has had a light vaginal discharge  Denies any urinary sxs

## 2016-08-25 NOTE — ED Provider Notes (Signed)
[facility]-EMERGENCY DEPT Provider Note   CSN: [id number] Arrival date & time: 08/25/16  2120  By signing my name below, I, Brittany Woods, attest that this documentation has been prepared under the direction and in the presence of Derwood KaplanAnkit Soleil Mas, MD. Electronically Signed: Javier Dockerobert Ryan Woods, ER Scribe. 04/09/2016. 12:09 AM.  History   Chief Complaint Chief Complaint  Patient presents with  . Abdominal Pain   The history is provided by the patient and a relative. No language interpreter was used.    HPI Comments: Brittany Woods is a 40 y.o. female who presents to the Emergency Department complaining of one week of constant dull lower abdominal and lower back pain. She denies vomiting, diarrhea. She is having harder than normal bowel movement and they have been darker than normal. No blood. She endorses several days of yellow vaginal discharge. She has a PMHx of STI. She has no PMHx of PID or other pelvic disorders. She has no chronic medical problems. She is on the Depo shot. Pt denies any recent unprotected intercourse.  History reviewed. No pertinent past medical history.  There are no active problems to display for this patient.   Past Surgical History:  Procedure Laterality Date  . CESAREAN SECTION  2002  . CHOLECYSTECTOMY      OB History    No data available       Home Medications    Prior to Admission medications   Medication Sig Start Date End Date Taking? Authorizing Provider  bismuth subsalicylate (PEPTO BISMOL) 262 MG chewable tablet Chew 524 mg by mouth daily as needed for indigestion.   Yes Historical Provider, MD  hydrocortisone cream 1 % Apply to affected area 2 times daily Patient not taking: Reported on 08/25/2016 02/23/16   Tharon AquasFrank C Patrick, PA  metroNIDAZOLE (FLAGYL) 500 MG tablet Take 1 tablet (500 mg total) by mouth 2 (two) times daily. X 7 days 08/26/16   Derwood KaplanAnkit Ai Sonnenfeld, MD  naproxen (NAPROSYN) 500 MG tablet Take 1 tablet (500 mg total) by mouth 2 (two)  times daily with a meal. 08/26/16   Derwood KaplanAnkit Kam Kushnir, MD    Family History Family History  Problem Relation Age of Onset  . Hypertension Other   . Diabetes Other     Social History Social History  Substance Use Topics  . Smoking status: Current Every Day Smoker  . Smokeless tobacco: Never Used  . Alcohol use Yes     Allergies   Patient has no known allergies.   Review of Systems Review of Systems A complete 10 system review of systems was obtained and all systems are negative except as noted in the HPI and PMH.   Physical Exam Updated Vital Signs BP 130/84   Pulse 92   Temp 98.2 F (36.8 C) (Oral)   Resp 18   Wt 294 lb 3.2 oz (133.4 kg)   SpO2 99%   Physical Exam  Constitutional: She is oriented to person, place, and time. She appears well-developed and well-nourished. No distress.  HENT:  Head: Normocephalic and atraumatic.  Eyes: Pupils are equal, round, and reactive to light.  Neck: Neck supple.  Cardiovascular: Normal rate and regular rhythm.   Pulmonary/Chest: Effort normal and breath sounds normal. No respiratory distress. She has no wheezes.  Abdominal:  TTP in lower quadrants. No rebound or guarding.   Musculoskeletal: Normal range of motion.  Neurological: She is alert and oriented to person, place, and time. Coordination normal.  Skin: Skin is warm and dry. She  is not diaphoretic.  Psychiatric: She has a normal mood and affect. Her behavior is normal.  Nursing note and vitals reviewed.   ED Treatments / Results  DIAGNOSTIC STUDIES: Oxygen Saturation is 100% on RA, normal by my interpretation.    COORDINATION OF CARE: 12:09 AM Discussed treatment plan with pt at bedside and pt agreed to plan.  Labs (all labs ordered are listed, but only abnormal results are displayed) Labs Reviewed  WET PREP, GENITAL - Abnormal; Notable for the following:       Result Value   Clue Cells Wet Prep HPF POC PRESENT (*)    WBC, Wet Prep HPF POC MODERATE (*)    All  other components within normal limits  URINALYSIS, ROUTINE W REFLEX MICROSCOPIC - Abnormal; Notable for the following:    APPearance CLOUDY (*)    Hgb urine dipstick MODERATE (*)    Protein, ur 30 (*)    Leukocytes, UA SMALL (*)    Bacteria, UA FEW (*)    Squamous Epithelial / LPF TOO NUMEROUS TO COUNT (*)    All other components within normal limits  PREGNANCY, URINE  GC/CHLAMYDIA PROBE AMP ([place]    EKG  EKG Interpretation None       Radiology No results found.  Procedures Procedures (including critical care time)  Medications Ordered in ED Medications - No data to display   Initial Impression / Assessment and Plan / ED Course  I have reviewed the triage vital signs and the nursing notes.  Pertinent labs & imaging results that were available during my care of the patient were reviewed by me and considered in my medical decision making (see chart for details).  Clinical Course    I personally performed the services described in this documentation, which was scribed in my presence. The recorded information has been reviewed and is accurate.  Pt comes in with cc of abd pain, back pain and vaginal discharge. PT is immunocompetent, no hx of pelvic disorders, abd surgery. PT has hx of STD, but denies any recent unprotected intercourse. She has no dysuria/polyuria/hematuria. No flank pain.  Abd exam is non focal and not peritoneal. We will do the pelvic exam. It seems thwat the origin of pain is likely pelvic at this time.  Final Clinical Impressions(s) / ED Diagnoses   Final diagnoses:  Bacterial vaginosis  Lower abdominal pain    New Prescriptions New Prescriptions   NAPROXEN (NAPROSYN) 500 MG TABLET    Take 1 tablet (500 mg total) by mouth 2 (two) times daily with a meal.         Derwood KaplanAnkit Gayland Nicol, MD 08/26/16 [phone]

## 2016-08-25 NOTE — ED Notes (Signed)
Updated, pending pelvic exam, [facility] VSS.

## 2016-08-26 LAB — WET PREP, GENITAL
Sperm: NONE SEEN
Trich, Wet Prep: NONE SEEN
YEAST WET PREP: NONE SEEN

## 2016-08-26 LAB — GC/CHLAMYDIA PROBE AMP (~~LOC~~) NOT AT ARMC
CHLAMYDIA, DNA PROBE: NEGATIVE
Neisseria Gonorrhea: NEGATIVE

## 2016-08-26 MED ORDER — METRONIDAZOLE 500 MG PO TABS: 500.0000 mg | ORAL_TABLET | Freq: Two times a day (BID) | ORAL | 0 refills | Status: DC

## 2016-08-26 MED ORDER — NAPROXEN 500 MG PO TABS: 500.0000 mg | ORAL_TABLET | Freq: Two times a day (BID) | ORAL | 0 refills | Status: DC

## 2016-08-26 NOTE — Discharge Instructions (Signed)
The lab results show bacterial vaginosis. Take the medicine prescribed [facility]. Return to the ER if the symptoms get worse.

## 2016-12-03 ENCOUNTER — Emergency Department (HOSPITAL_COMMUNITY)
Admission: EM | Admit: 2016-12-03 | Discharge: 2016-12-04 | Disposition: A | Discharge status: Home / Self Care | Payer: BLUE CROSS/BLUE SHIELD | Attending: Emergency Medicine | Admitting: Emergency Medicine

## 2016-12-03 ENCOUNTER — Emergency Department (HOSPITAL_COMMUNITY): Payer: BLUE CROSS/BLUE SHIELD

## 2016-12-03 ENCOUNTER — Encounter (HOSPITAL_COMMUNITY): Payer: Self-pay

## 2016-12-03 DIAGNOSIS — M545 Low back pain, unspecified: Secondary | ICD-10-CM

## 2016-12-03 DIAGNOSIS — R350 Frequency of micturition: Secondary | ICD-10-CM | POA: Diagnosis present

## 2016-12-03 DIAGNOSIS — N3001 Acute cystitis with hematuria: Secondary | ICD-10-CM | POA: Diagnosis not present

## 2016-12-03 DIAGNOSIS — F17200 Nicotine dependence, unspecified, uncomplicated: Secondary | ICD-10-CM | POA: Diagnosis not present

## 2016-12-03 LAB — CBC WITH DIFFERENTIAL/PLATELET
Basophils Absolute: 0 10*3/uL (ref 0.0–0.1)
Basophils Relative: 0 %
Eosinophils Absolute: 0.1 10*3/uL (ref 0.0–0.7)
Eosinophils Relative: 1 %
HCT: 42.2 % (ref 36.0–46.0)
Hemoglobin: 14.2 g/dL (ref 12.0–15.0)
Lymphocytes Relative: 49 %
Lymphs Abs: 3.1 10*3/uL (ref 0.7–4.0)
MCH: 29.4 pg (ref 26.0–34.0)
MCHC: 33.6 g/dL (ref 30.0–36.0)
MCV: 87.4 fL (ref 78.0–100.0)
Monocytes Absolute: 0.3 10*3/uL (ref 0.1–1.0)
Monocytes Relative: 5 %
Neutro Abs: 2.8 10*3/uL (ref 1.7–7.7)
Neutrophils Relative %: 45 %
Platelets: 316 10*3/uL (ref 150–400)
RBC: 4.83 MIL/uL (ref 3.87–5.11)
RDW: 13.3 % (ref 11.5–15.5)
WBC: 6.2 10*3/uL (ref 4.0–10.5)

## 2016-12-03 LAB — COMPREHENSIVE METABOLIC PANEL
ALT: 17 U/L (ref 14–54)
AST: 25 U/L (ref 15–41)
Albumin: 3.4 g/dL — ABNORMAL LOW (ref 3.5–5.0)
Alkaline Phosphatase: 49 U/L (ref 38–126)
Anion gap: 9 (ref 5–15)
BUN: 10 mg/dL (ref 6–20)
CO2: 23 mmol/L (ref 22–32)
Calcium: 8.9 mg/dL (ref 8.9–10.3)
Chloride: 108 mmol/L (ref 101–111)
Creatinine, Ser: 0.87 mg/dL (ref 0.44–1.00)
GFR calc Af Amer: >60 mL/min (ref >60)
GFR calc non Af Amer: >60 mL/min (ref >60)
Glucose, Bld: 120 mg/dL — ABNORMAL HIGH (ref 65–99)
Potassium: 3.7 mmol/L (ref 3.5–5.1)
Sodium: 140 mmol/L (ref 135–145)
Total Bilirubin: 0.4 mg/dL (ref 0.3–1.2)
Total Protein: 7.2 g/dL (ref 6.5–8.1)

## 2016-12-03 LAB — URINALYSIS, ROUTINE W REFLEX MICROSCOPIC
Bilirubin Urine: NEGATIVE
GLUCOSE, UA: NEGATIVE mg/dL
KETONES UR: NEGATIVE mg/dL
NITRITE: POSITIVE — AB
Protein, ur: NEGATIVE mg/dL
Specific Gravity, Urine: 1.016 (ref 1.005–1.030)
pH: 5 (ref 5.0–8.0)

## 2016-12-03 LAB — I-STAT BETA HCG BLOOD, ED (MC, WL, AP ONLY): I-stat hCG, quantitative: <5 m[IU]/mL (ref <5)

## 2016-12-03 LAB — I-STAT CG4 LACTIC ACID, ED: Lactic Acid, Venous: 2.2 mmol/L (ref 0.5–1.9)

## 2016-12-03 MED ORDER — SODIUM CHLORIDE 0.9 % IV BOLUS (SEPSIS)
1000.0000 mL | Freq: Once | INTRAVENOUS | Status: AC
  Administered 2016-12-03: 1000 mL via INTRAVENOUS

## 2016-12-03 MED ORDER — SULFAMETHOXAZOLE-TRIMETHOPRIM 800-160 MG PO TABS: 1.0000 | ORAL_TABLET | Freq: Two times a day (BID) | ORAL | 0 refills | Status: AC

## 2016-12-03 MED ORDER — IOPAMIDOL (ISOVUE-300) INJECTION 61%
100.0000 mL | Freq: Once | INTRAVENOUS | Status: AC | PRN
  Administered 2016-12-03: 100 mL via INTRAVENOUS

## 2016-12-03 MED ORDER — CYCLOBENZAPRINE HCL 10 MG PO TABS: 10.0000 mg | ORAL_TABLET | Freq: Three times a day (TID) | ORAL | 0 refills | Status: AC | PRN

## 2016-12-03 NOTE — ED Provider Notes (Signed)
[facility]-EMERGENCY DEPT Provider Note   CSN: [id number] Arrival date & time: 12/03/16  2044   By signing my name below, I, Brittany Woods, attest that this documentation has been prepared under the direction and in the presence of Brittany Pitcher, PA-C Electronically Signed: Soijett Woods, ED Scribe. 12/03/16. 10:02 PM.  History   Chief Complaint Chief Complaint  Patient presents with  . Urinary Frequency  . Back Pain    HPI Brittany Woods is a 41 y.o. female who presents to the Emergency Department complaining of urinary frequency (2 episodes per hour) onset 1 week ago. Pt reports associated malodorous urine, progressively worsening left lower back pain x burning sensation for the past 2 weeks, and RLQ abdominal pain. Pt has tried aleve with mild relief of her symptoms. Pt notes that her lower back pain is worsened with prolonged standing and denies prior hx of back pain. Denies trauma, falls, or any other inciting events. 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 She reports that she still has her appendix at this time. Denies PMHx of kidney stones, but notes that she has had a hx of gallstones. She denies dysuria, vaginal itching, vaginal discharge, blood in stool, bowel or bladder incontinence, gait problem, numbness, tingling, weakness, CP, SOB, fever, chills, HA, recent falls, and any other symptoms. Pt reports that she is currently on the depo-provera with her last menstrual cycle being in 2016. She states that she is currently abstinent.     The history is provided by the patient. No language interpreter was used.    History reviewed. No pertinent past medical history.  There are no active problems to display for this patient.   Past Surgical History:  Procedure Laterality Date  . CESAREAN SECTION  2002  . CHOLECYSTECTOMY      OB History    No data available       Home Medications    Prior to Admission medications     Medication Sig Start Date End Date Taking? Authorizing Provider  bismuth subsalicylate (PEPTO BISMOL) 262 MG chewable tablet Chew 524 mg by mouth daily as needed for indigestion.    Historical Provider, MD  cyclobenzaprine (FLEXERIL) 10 MG tablet Take 1 tablet (10 mg total) by mouth 3 (three) times daily as needed for muscle spasms. 12/03/16 12/07/16  Arvind Mexicano A Anothony Bursch, PA-C  hydrocortisone cream 1 % Apply to affected area 2 times daily Patient not taking: Reported on 08/25/2016 02/23/16   Tharon Aquas, PA  metroNIDAZOLE (FLAGYL) 500 MG tablet Take 1 tablet (500 mg total) by mouth 2 (two) times daily. X 7 days 08/26/16   Derwood Kaplan, MD  naproxen (NAPROSYN) 500 MG tablet Take 1 tablet (500 mg total) by mouth 2 (two) times daily with a meal. 08/26/16   Derwood Kaplan, MD  sulfamethoxazole-trimethoprim (BACTRIM DS,SEPTRA DS) 800-160 MG tablet Take 1 tablet by mouth 2 (two) times daily. 12/03/16 12/10/16  Jeanie Sewer, PA-C    Family History Family History  Problem Relation Age of Onset  . Hypertension Other   . Diabetes Other     Social History Social History  Substance Use Topics  . Smoking status: Current Every Day Smoker  . Smokeless tobacco: Never Used  . Alcohol use Yes     Allergies   Patient has no known allergies.   Review of Systems Review of Systems  Constitutional: Negative for chills and fever.  Respiratory: Negative for shortness of breath.  Cardiovascular: Negative for chest pain.  Gastrointestinal: Positive for abdominal pain (lower abdomen). Negative for blood in stool.       No bowel incontinence.   Genitourinary: Positive for frequency. Negative for dysuria and vaginal discharge.       +Malodorous urine. No bladder incontinence.   Musculoskeletal: Positive for back pain (left lower). Negative for gait problem.  Neurological: Negative for weakness, numbness and headaches.       No tingling     Physical Exam Updated Vital Signs BP 136/85   Pulse 85   Temp  99.1 F (37.3 C) (Oral)   Resp 16   Ht  (1.676 m)   Wt 122.9 kg   SpO2 98%   BMI 43.74 kg/m   Physical Exam  Constitutional: She is oriented to person, place, and time. She appears well-developed and well-nourished. No distress.  HENT:  Head: Normocephalic and atraumatic.  Eyes: EOM are normal.  Neck: Neck supple. No JVD present. No tracheal deviation present.  Cardiovascular: Normal rate, regular rhythm and normal heart sounds.  Exam reveals no gallop and no friction rub.   No murmur heard. Pulmonary/Chest: Effort normal and breath sounds normal. No respiratory distress. She has no wheezes. She has no rales.  Abdominal: Soft. She exhibits no distension. There is tenderness in the right lower quadrant.  RLQ ttp. Negative Murphey's, Negative Rovsing's, negative Psoas sign. No hepatosplenomegaly. No suprapubic tenderness   Genitourinary:  Genitourinary Comments: No CVA tenderness.  Musculoskeletal: Normal range of motion.  No midline CSP, TSP, or LSP ttp. Left lower paraspinal musculature tender to palpation. No crepitus, deformity, or step-off noted. Pain with extension of LSP. No pain on flexion. Full ROM of LSP. No SIJ pain or dysfunction. 5/5 strength BLE. Normal gait.  Neurological: She is alert and oriented to person, place, and time.  Fluent speech, no facial droop, normal gait, pt able to heel walk and toe walk. Sensation intact.   Skin: Skin is warm and dry.  Psychiatric: She has a normal mood and affect. Her behavior is normal.  Nursing note and vitals reviewed.    ED Treatments / Results  DIAGNOSTIC STUDIES: Oxygen Saturation is 97% on RA, nl by my interpretation.    COORDINATION OF CARE: 9:18 PM Discussed treatment plan with pt at bedside which includes UA, labs, CT abdomen pelvis, and pt agreed to plan.   Labs (all labs ordered are listed, but only abnormal results are displayed) Labs Reviewed  URINALYSIS, ROUTINE W REFLEX MICROSCOPIC - Abnormal; Notable for  the following:       Result Value   APPearance CLOUDY (*)    Hgb urine dipstick SMALL (*)    Nitrite POSITIVE (*)    Leukocytes, UA MODERATE (*)    Bacteria, UA MANY (*)    Squamous Epithelial / LPF 6-30 (*)    All other components within normal limits  COMPREHENSIVE METABOLIC PANEL - Abnormal; Notable for the following:    Glucose, Bld 120 (*)    Albumin 3.4 (*)    All other components within normal limits  I-STAT CG4 LACTIC ACID, ED - Abnormal; Notable for the following:    Lactic Acid, Venous 2.20 (*)    All other components within normal limits  URINE CULTURE  CBC WITH DIFFERENTIAL/PLATELET  I-STAT BETA HCG BLOOD, ED ([facility], [facility], [facility] ONLY)    EKG  EKG Interpretation None       Radiology Ct Abdomen Pelvis W Contrast  Result Date: 12/03/2016 CLINICAL DATA:  Left lower back pain for 3 weeks, worsened tonight. EXAM: CT ABDOMEN AND PELVIS WITH CONTRAST TECHNIQUE: Multidetector CT imaging of the abdomen and pelvis was performed using the standard protocol following bolus administration of intravenous contrast. CONTRAST:  ISOVUE-300 IOPAMIDOL (ISOVUE-300) INJECTION 61% COMPARISON:  None. FINDINGS: Lower chest: No acute abnormality. Hepatobiliary: No focal liver abnormality is seen. Status post cholecystectomy. No biliary dilatation. Pancreas: Unremarkable. No pancreatic ductal dilatation or surrounding inflammatory changes. Spleen: Normal in size without focal abnormality. Adrenals/Urinary Tract: Adrenal glands are unremarkable. Absent left kidney. The right kidney is normal, without renal calculi, focal lesion, or hydronephrosis. Bladder is unremarkable. Stomach/Bowel: Stomach is within normal limits. Appendix is normal. The colon is remarkable only for uncomplicated mild diverticulosis No evidence of bowel wall thickening, distention, or inflammatory changes. Vascular/Lymphatic: No significant vascular findings are present. No enlarged abdominal or pelvic lymph nodes. Reproductive:  Uterus and bilateral adnexa are unremarkable. Other: No inflammatory changes. No ascites. Small fat containing umbilical hernia. Musculoskeletal: No significant skeletal lesion. IMPRESSION: 1. No acute findings.  Normal appendix. 2. Absent left kidney.  Normal right kidney. Electronically Signed   By: Ellery Plunk M.D.   On: 12/03/2016 23:41    Procedures Procedures (including critical care time)  Medications Ordered in ED Medications  sodium chloride 0.9 % bolus 1,000 mL (0 mLs Intravenous Stopped 12/04/16 0002)  iopamidol (ISOVUE-300) 61 % injection 100 mL (100 mLs Intravenous Contrast Given 12/03/16 2311)     Initial Impression / Assessment and Plan / ED Course  I have reviewed the triage vital signs and the nursing notes.  Pertinent labs & imaging results that were available during my care of the patient were reviewed by me and considered in my medical decision making (see chart for details).     41yof presents with 2 week history left sided low back pain and 1 week history urinary frequency with associated bad odor. Pt afebrile, initially hypertensive on presentation but normotensive on re-evaluation. States she was mildly hypertensive when she [facility] recently; recommend f/u with PCP for re-evaluation and possible initiation of HTN medications. No neurological deficits and normal neuro exam.  Patient is ambulatory without pain. Some left sided paraspinal ttp and pain with extension. No midline tenderness. No radiculopathy.  No loss of bowel or bladder control or fever.  No concern for cauda equina.  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 RICE protocol, heat/ice prn, and muscle relaxer indicated and discussed with patient. Will rx flexeril prn muscle spasm. Abdominal exam with mild ttp of RLQ - CBC and CMP unremarkable. Lactate mildly elevated, but no indications of sepsis. CT abdomen pelvis significant for only one kidney  (right-sided), but no acute abdominopelvic processes. Negative pregnancy. Low suspicion ectopic pregnancy, TOA , appendicitis, or colitis. UA consistent with UTI with nitrites, numerous WBC . Pt has been diagnosed with a UTI. Pt is afebrile, no CVA tenderness, normotensive on re-examination, and denies N/V. Pt to be dc home with antibiotics and instructions to follow up with PCP if symptoms persist. Given strict ED return precautions. Pt verbalized understanding of and agreement with plan and is safe for discharge home.    Final Clinical Impressions(s) / ED Diagnoses   Final diagnoses:  Acute left-sided low back pain without sciatica  Acute cystitis with hematuria    New Prescriptions New Prescriptions   CYCLOBENZAPRINE (FLEXERIL) 10 MG TABLET    Take 1 tablet (10 mg total) by mouth 3 (  three) times daily as needed for muscle spasms.   SULFAMETHOXAZOLE-TRIMETHOPRIM (BACTRIM DS,SEPTRA DS) 800-160 MG TABLET    Take 1 tablet by mouth 2 (two) times daily.   I personally performed the services described in this documentation, which was scribed in my presence. The recorded information has been reviewed and is accurate.     Jeanie Sewer, PA-C 12/04/16 0012    Jeanie Sewer, PA-C 12/04/16 0015    Loren Racer, MD 12/06/16 [phone]

## 2016-12-03 NOTE — ED Notes (Signed)
Attempted to gain IV access twice, without success. Placed order for IV Team consult.

## 2016-12-03 NOTE — ED Notes (Signed)
IV Team at bedside 

## 2016-12-03 NOTE — ED Notes (Addendum)
Pt using restroom to give a urine sample. Pt unable to catch a urine sample.

## 2016-12-03 NOTE — ED Triage Notes (Signed)
Pt presents to the ed for complaints of low back pain and frequency in urination and foul smell to urine for over a week, pt is ambulatory and in no apparent distress in triage. States she thinks she has a uti.

## 2016-12-03 NOTE — ED Notes (Signed)
I Stat Lactic Acid results shown to [facility]

## 2016-12-03 NOTE — ED Notes (Signed)
Patient transported to CT 

## 2016-12-04 NOTE — ED Notes (Signed)
Pt departed in NAD, refused use of wheelchair.  

## 2016-12-06 LAB — URINE CULTURE: Culture: >=100000 — AB

## 2016-12-07 ENCOUNTER — Telehealth: Payer: Self-pay | Admitting: Emergency Medicine

## 2016-12-07 NOTE — Telephone Encounter (Signed)
Post ED Visit - Positive Culture Follow-up  Culture report reviewed by antimicrobial stewardship pharmacist:   Enzo Bi, Pharm.D.  Celedonio Miyamoto, Pharm.D., BCPS AQ-ID  Garvin Fila, Pharm.D., BCPS  Georgina Pillion, Pharm.D., BCPS  [place], [address].D., BCPS, AAHIVP  Estella Husk, Pharm.D., BCPS, AAHIVP  Lysle Pearl, PharmD, BCPS  Casilda Carls, PharmD, BCPS  Pollyann Samples, PharmD, BCPS  Positive urine culture Treated with bactrim DS, organism sensitive to the same and no further patient follow-up is required at this time.  Berle Mull 12/07/2016, 1:03 PM

## 2016-12-15 ENCOUNTER — Other Ambulatory Visit: Payer: Self-pay | Admitting: Nurse Practitioner

## 2016-12-15 DIAGNOSIS — Z1231 Encounter for screening mammogram for malignant neoplasm of breast: Secondary | ICD-10-CM

## 2016-12-28 ENCOUNTER — Ambulatory Visit
Admission: RE | Admit: 2016-12-28 | Discharge: 2016-12-28 | Disposition: A | Discharge status: Home / Self Care | Payer: BLUE CROSS/BLUE SHIELD | Source: Ambulatory Visit | Attending: Nurse Practitioner | Admitting: Nurse Practitioner

## 2016-12-28 DIAGNOSIS — Z1231 Encounter for screening mammogram for malignant neoplasm of breast: Secondary | ICD-10-CM

## 2017-10-03 ENCOUNTER — Ambulatory Visit: Payer: Self-pay | Admitting: Family

## 2017-10-03 DIAGNOSIS — Z111 Encounter for screening for respiratory tuberculosis: Secondary | ICD-10-CM

## 2017-10-03 NOTE — Progress Notes (Signed)
Patient presents for PPD placement for  Denies previous positive TB test  Denies known exposure to TB   Tuberculin skin test applied to right forearm.  Patient informed to return to [facility] in 48-72 hours for PPD read. Vaccine Information Statement provided to patient.    

## 2017-10-05 LAB — TB SKIN TEST
INDURATION: 0 mm
TB SKIN TEST: NEGATIVE

## 2017-11-08 ENCOUNTER — Encounter: Payer: Self-pay | Admitting: Emergency Medicine

## 2017-11-08 ENCOUNTER — Ambulatory Visit: Payer: Self-pay | Admitting: Emergency Medicine

## 2017-11-08 VITALS — BP 124/90 | HR 84 | Resp 16

## 2017-11-08 DIAGNOSIS — Z021 Encounter for pre-employment examination: Secondary | ICD-10-CM

## 2017-11-08 NOTE — Progress Notes (Signed)
   Brittany Woods is a 42 y.o. 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 TB test placed and read in Feb, negative, no chronic health history or daily medicines, no history of psychiatric or mental health complaints.  No family history of sickle cell disease. No family history of sudden cardiac death. No current medical concerns or physical ailment.    Review of Systems  Constitutional: Negative.   HENT: Negative.   Eyes: Negative.   Respiratory: Negative.   Cardiovascular: Negative.   Gastrointestinal: Negative.   Musculoskeletal: Negative.   Skin: Negative.   Neurological: Negative.     PHYSICAL EXAM: Vitals:   11/08/17 1330  BP: 124/90  Pulse: 84  Resp: 16    42 y.o. female    NAD HEENT: Atraumatic, normocephalic, nasal septum midline, no sinus tenderness, uvula midline without tonsillar erythema or exudate Neck: No cervical lymphadenopathy, full ROM without pain, no point tenderness Lungs: Clear and equal bilaterally Heart: Regular rate and rhythm without murmur. Distal pulses present and equal bilaterally Abdomen: SNT, ND, Bowel sounds normal Musculoskeletal and spine exam: No pain with flexion or extension of the spine, no pain with flexion, extension, or rotation of the hip, knees, or elbow Skin: Warm, dry, no rashes or lesions noted Neuro: Cranial Nerves II-XII grossly intact, normal gate, normal heel-toe gate, brachia and patellar reflexes 2+   Assessment: Normal Physical exam without abnormal findings  Plan:           Form completed, to be scanned into EMR chart.          Followup with PCP for ongoing preventive care and immunizations.          Return as needed for any acute care

## 2017-11-08 NOTE — Patient Instructions (Signed)
[employer] Prudent Diet LOW FAT, LOW CHOLESTEROL,3 GRAM SODIUM  This diet provides guidelines for selecting foods low in total fat, saturated fat and cholesterol without added salt  BENEFITS  Helps decrease your blood cholesterol level  Helps control your blood pressure  GUIDELINES  Reduce total fat by eating less margarine, salad dressing and oil.  Avoid fried foods, fatty meat, and whole milk items, including cheese and ice cream.  Choose foods low in saturated fat, which usually come from animal fats.  Tree plant oils, coconut, palm and palm kernel are very high in saturated fat.  Selected foods low in cholesterol.  Cholesterol is only found in foods from animal fats. Foods from plants contain no cholesterol.  Eat less salt and sodium.  Processed, cured and canned foods are usually high in salt.  Prepare food without salt or with a small amount of salt in cooking (no more than 1/4 tsp per day)  Do not add salt at the table.  Eat more vegetables fruits, breads, cereals, pasta, rice and dry beans and peas.  These foods contain little or no fat or cholesterol.  Eat more fiber.  The type of fiber in oats, barley, dry beans and peas, and many fruits and vegetables help lower blood cholesterol levels.    Food Groups Choose Go Easy On Avoid   Meats  Poultry  Fish  Dry Beans  Eggs  Nuts  Lean cuts of meat  Chicken  Turkey  Fish  Egg Whites  Beans/Tofu  Shellfish  Duck  Egg yolks  Nuts  Processed meat such as bacon and bologna  Hot Dogs    Milk  Yogurt  Cheese  Fat-Free or low-fat dairy products  Skim or 1% Milk  Cheese with no more than 3 grams of fat per ounce  Low fat yogurt  2% fat milk  Sour cream  Whole milk  Swiss,American Cheddar cheese  Cream cheese   Fats, oils  Corn  Olive  Canola  Sunflower oils  Avocados  Olives  Peanut oil  Butter, lard  Bacon Fat  Coconut oil  Solid shortening   Breads,  cereals, pasta, rice  Whole-grain bread  Whole wheat pasta  Whole grain rice  Plain baked potato  Granola  Biscuits  Muffins  Cornbread  Croissants  Pastries  Egg Noodles  Doughnuts   Fruits  Vegetables  Fresh, frozen  Dried  Canned fruit in syrup  Coconut  Vegetables prepared in oil   Snacks  Sorbet  Low-fat yogurt  Plain popcorn  Pretzels  Fruits/Veggies  Homemade cakes, cookies and pies with unsaturated oils  Baked chips  Ice cream  Chocolate  Potato chips  Buttered popcorn     GETTING STARTED ON CHOLESTEROL CONTROL  1. Learn about cholesterol, understand how your heart works and discover how high cholesterol can cause heart disease. 2. Use this information to understand your body and talk to your doctor about your lab report numbers, your risk factors and your lifestyle. 3. Take action.  Lifestyle changes such as exercising, eating healthier foods, and losing weight may help improve your cholesterol levels.  A SILENT PROBLEM  High cholesterol usually has no direct symptoms.  When your cholesterol is higher  Than it should be, you may not feel different, but cholesterol may still do damage to blood vessels.  As blood flows through the vessels, it carries many of the important things the body needs to function, such as oxygen an cholesterol.  So, problems with   blood vessels can lead to heart disease or stroke.  STEP 1: UNDERSTAND CHOLESTEROL   Cholesterol is a soft, fat-like, waxy substance in your bloodstream and in all your  body's cells.  It's normal to have cholesterol. It's an important part of a healthy  body.  But too high a level of cholesterol in the blood is a major risk factor for  heart disease, which can lead to a heart attack.  High cholesterol is also a major r risk factor for stroke.    Cholesterol in the blood comes from 2 sources. One is food.  Cholesterol is also  produced naturally in your body based on your family  history.   Most of the cholesterol in your blood is made by your own body.  In fact, only  about 25% of blood cholesterol comes from the food you eat.  Examples of foods  that add to your cholesterol are meats, poultry, fish, eggs, butter, cheese and  whole milk.  THE KINDS OF FATS IN YOUR BODY  1. LDL-BAD CHOLESTEROL: LDL is often called "bad"cholesterol"  That is because if there's too much, it can build up in the body.  LDL forms a thick, hard substances that can clog your blood vessels and can block the flow of blood to your heart and brain. 2. HDL-GOOD CHOLESTEROL-HDL is often called "good cholesterol".  It helps your body get rid of bad cholesterol.  HDL collects excess cholesterol that LDL has left behind in the body. 3. TRIGLYCERIDES-Triglycerides are a very special form of fat.  Levels of triglycerides in blood can sometimes be too high.  Triglycerides travel in the bloodstream to be used for energy stored as body fat.   THE 2 SOURCES OF CHOLESTEROL IN YOUR BODY  The body takes cholesterol and triglycerides from food and allows them to enter the blood.  At the same time, the liver and other body cells produce cholesterol. If this process works well, arteries remain healthy.  If not, the cholesterol builds up.  This can lead to artery damage and heart disease.  HEALTHY LEVELS  A diet low in calories and fat may help you have healthy cholesterol levels.  When levels are healthy, cholesterol gets to whre it's needed in the body without building up inside the arteries.  UNHEALTHY LEVELS  Unhealthy levels of cholesterol may be caused  By poor eating habits.  Or the liver and body cells may make too much-a problem that often runs in families.  If LDL levels are high and HDL levels are low, excess cholesterol builds up.  This damages and clogs the arteries.  STEP 2: KNOW YOUR NUMBERS AND UNDERSTAND YOUR RISK  If your cholesterol level is too high, you could be heading for a heart attack or  stroke without knowing it.  In general, the higher your cholesterol level and the more risk factors you have, the higher your risk of developing heart disease or having a heart attack.   High LDL cholesterol is one risk factor for heart disease.  Other risk factors, such as diabetes, also puts people at a higher risk.  Use this personal worksheet to learn about your risk factors so you can learn more about your risk of developing heart disease or stroke.  YOUR PERSONAL WORKSHEET 1. Have your been diagnosed with heart disease? 2. Have you had a stroke? 3. Have you ever had a blockage in your legs arteries? 4. Do you have diabetes? 5. Do you have high blood   pressure (140/90 or higher) or are  You on blood pressure medication? 6. Do you have a family history of early heart disease (heart disease  In father or brother before age 55; heart disease in mother or sister before age 65y)? 7. Do you smoke cigarettes? 8. Are you a man age 45 or older or a woman age 55 or older? 9. Do you have low HDL (good) cholesterol (less than 40 mg/dL)?  *the more "yes" answers you checked, the greater your risk of developing heart disease.   UNDERSTAND YOUR RESULTS  After you finish the worksheet, talk to your doctor about what your LDL goal should be.  The answers you provide will help your doctor assess your risk of developing heart disease or having a heart attack.  WHY DO I HAVE HIGH CHOLESTEROL, AND WHAT CAN HAPPEN TO ME?  While some of your cholesterol is caused by your diet, your family history plays a larger role than many people think.  Like many people, you may not know your body produces cholesterol naturally, based on family history.  Having high LDL (bad) cholesterol levels can put you at risk of heart disease, heart attack or stroke.  This is especially true if you have any additional risk factors, such as those listed on the worksheet.  HOW YOUR HEART IS THREATENED  Over time, unhealthy cholesterol  levels can narrow or block arteries, limiting blood flow. Plaque, a fatty material made mainly of cholesterol, can form in artery walls.  When blocked by plaque, the body parts that are not receiving enough blood can become damaged.  This can be especially dangerour to the brain and heart.  HOW PLAQUE CAUSED TROUBLE  Plaque buildup narrows the space inside the artery.  When this happens, the artery cannot carry as much blood.  Plaque makes arteries stiff.  Stiff arteries cannot stretch to allow increase blood flow when it's needed.  For example, when exercising.  Plaque can break open, causing blood clots to form on the surface of the plaque.  This can cut off blood flow.  Small pieces of blood clot can break off from plaque and block smaller arteries.  When blood flow is blocked, it can affect not only your heart, but also other parts of your body, including your brain.  This model shows some of the most common and serious effects of artery damage and blockage.   STEP 3 TAKE ACTION TO CONTROL YOUR CHOLESTEROL  What can I do?  Although there is no cure for high cholesterol, it can be controlled.  Lifestyle changes and medicine, if needed, can help restore your body's cholesterol balance and also can help control other risk factors.  MAKE A PLAN!  Along with your doctor, you can work out a plan to control your cholesterol.  Your plan will depend on your risk factors and test results.  It should include some or all of the following:  Make changes to your diet.  Certain changes in the types of food you eat can help lower LDL, control high blood pressure and control diabetes.  Exercise.  Exercise can help raise HDL and help control high blood pressure and diabetes.  Choose activities you enjoy and stick with them.  Make exercise a regular part of your life.  Control your weight.  If you are overweight, aim to lose weight and maintain your weight loss.  Stop Smoking.  Being smoke-free can  improve your cholesterol and blood pressure.  Another good reason to   quit is that smoking also damages your lungs, eyes, and skin not to mention the health of your family members.  Be more active.  Activity is not just exercise that makes you sweat.  It's also things like gardening or playing with your kids/grandkids.  Get in the habit of being more active throughout the day.  Cut down on TV time.  When doing errands, walk an extra few blocks instead of driving and park at each stop. Ask friends or family members to join in.  Talk to your doctor about starting some of the exercise tips listed below and stick to them.  Make sure you talk to your doctor  Before starting any exercise program.  EXERCISE TIPS  AT HOME:  Housework cleaning taking out the trash  Yard work: mow the lawn, rake the leaves, work in the garden  If you have exercise equipment, use it! Pedal a stationary bike while watching TV  Longer walks with the dog 

## 2017-12-13 ENCOUNTER — Other Ambulatory Visit: Payer: Self-pay | Admitting: Nurse Practitioner

## 2017-12-13 DIAGNOSIS — Z1231 Encounter for screening mammogram for malignant neoplasm of breast: Secondary | ICD-10-CM

## 2018-01-02 ENCOUNTER — Ambulatory Visit
Admission: RE | Admit: 2018-01-02 | Discharge: 2018-01-02 | Disposition: A | Discharge status: Home / Self Care | Payer: BLUE CROSS/BLUE SHIELD | Source: Ambulatory Visit | Attending: Nurse Practitioner | Admitting: Nurse Practitioner

## 2018-01-02 DIAGNOSIS — Z1231 Encounter for screening mammogram for malignant neoplasm of breast: Secondary | ICD-10-CM

## 2018-09-01 ENCOUNTER — Encounter (HOSPITAL_COMMUNITY): Payer: Self-pay | Admitting: Emergency Medicine

## 2018-09-01 ENCOUNTER — Ambulatory Visit (HOSPITAL_COMMUNITY)
Admission: EM | Admit: 2018-09-01 | Discharge: 2018-09-01 | Disposition: A | Discharge status: Home / Self Care | Payer: BLUE CROSS/BLUE SHIELD | Attending: Family Medicine | Admitting: Family Medicine

## 2018-09-01 DIAGNOSIS — Z3202 Encounter for pregnancy test, result negative: Secondary | ICD-10-CM

## 2018-09-01 DIAGNOSIS — N898 Other specified noninflammatory disorders of vagina: Secondary | ICD-10-CM

## 2018-09-01 LAB — POCT PREGNANCY, URINE: PREG TEST UR: NEGATIVE

## 2018-09-01 MED ORDER — METRONIDAZOLE 500 MG PO TABS: 500.0000 mg | ORAL_TABLET | Freq: Three times a day (TID) | ORAL | 0 refills | Status: AC

## 2018-09-01 NOTE — Discharge Instructions (Addendum)
You have been given the following medications today for treatment of suspected bacterial vaginosis (BV):  Meds ordered this encounter  Medications   metroNIDAZOLE (FLAGYL) 500 MG tablet    Sig: Take 1 tablet (500 mg total) by mouth 3 (three) times daily.    Dispense:  14 tablet    Refill:  0   Even though we have treated you today, we have sent testing for sexually transmitted infections. We will notify you of any positive results once they are received. If required, we will prescribe any medications you might need.  Please refrain from all sexual activity for at least the next seven days.

## 2018-09-01 NOTE — ED Triage Notes (Signed)
Pt states she has vaginal irritation, hx of BV. Also lower pelvic cramping. Since last night.

## 2018-09-03 LAB — CERVICOVAGINAL ANCILLARY ONLY
Bacterial vaginitis: NEGATIVE
Candida vaginitis: NEGATIVE
Chlamydia: NEGATIVE
Neisseria Gonorrhea: NEGATIVE
TRICH (WINDOWPATH): NEGATIVE

## 2018-09-03 NOTE — ED Provider Notes (Signed)
[facility]   [id number] 09/01/18 Arrival Time: 1428  ASSESSMENT & PLAN:  1. Vaginal irritation   2. Vaginal discharge    She has no suspicion for STI. Prefers to treat for recurrent BV and await cytology results. Declines HIV/RPR testing. No signs/symptoms of PID. UPT negative.   Discharge Instructions      You have been given the following medications today for treatment of suspected bacterial vaginosis (BV):  Meds ordered this encounter  Medications  . metroNIDAZOLE (FLAGYL) 500 MG tablet    Sig: Take 1 tablet (500 mg total) by mouth 3 (three) times daily.    Dispense:  14 tablet    Refill:  0   Even though we have treated you today, we have sent testing for sexually transmitted infections. We will notify you of any positive results once they are received. If required, we will prescribe any medications you might need.  Please refrain from all sexual activity for at least the next seven days.     Pending: Labs Reviewed  POCT PREGNANCY, URINE  CERVICOVAGINAL ANCILLARY ONLY    Will notify of any positive results. Instructed to refrain from sexual activity for at least seven days.  Reviewed expectations re: course of current medical issues. Questions answered. Outlined signs and symptoms indicating need for more acute intervention. Patient verbalized understanding. After Visit Summary given.   SUBJECTIVE:  Brittany Woods is a 43 y.o. female who presents with complaint of vaginal discharge. H/O recurrent BV; "this feels the same". Onset gradual. First noticed 2-3 days ago; more yesterday. Describes discharge as thin and white/yellow. Urinary symptoms: none. Afebrile. No abdominal or pelvic pain. Mild "vaginal irritation" but reports no skin changes. No n/v. No rashes or lesions. Sexually active with single female partner for many years. OTC treatment: none.  No LMP recorded. Patient has had an injection.  ROS: As per HPI. All other systems negative.    OBJECTIVE:  Vitals:   09/01/18 1617  BP: (!) 142/80  Pulse: 86  Resp: 18  Temp: 98 F (36.7 C)  SpO2: 100%     General appearance: alert, cooperative, appears stated age and no distress Throat: lips, mucosa, and tongue normal; teeth and gums normal CV: RRR Lungs: CTAB Back: no CVA tenderness; FROM at waist Abdomen: soft, non-tender GU: deferred Skin: warm and dry Psychological: alert and cooperative; normal mood and affect.  Results for orders placed or performed during the hospital encounter of 09/01/18  Pregnancy, urine POC  Result Value Ref Range   Preg Test, Ur NEGATIVE NEGATIVE    Labs Reviewed  POCT PREGNANCY, URINE  CERVICOVAGINAL ANCILLARY ONLY    No Known Allergies  PMH: As in HPI.  Family History  Problem Relation Age of Onset  . Hypertension Other   . Diabetes Other    Social History   Socioeconomic History  . Marital status: Single    Spouse name: Not on file  . Number of children: Not on file  . Years of education: Not on file  . Highest education level: Not on file  Occupational History  . Not on file  Social Needs  . Financial resource strain: Not on file  . Food insecurity:    Worry: Not on file    Inability: Not on file  . Transportation needs:    Medical: Not on file    Non-medical: Not on file  Tobacco Use  . Smoking status: Current Every Day Smoker  . Smokeless tobacco: Never Used  Substance  and Sexual Activity  . Alcohol use: Yes  . Drug use: No  . Sexual activity: Yes    Birth control/protection: Condom  Lifestyle  . Physical activity:    Days per week: Not on file    Minutes per session: Not on file  . Stress: Not on file  Relationships  . Social connections:    Talks on phone: Not on file    Gets together: Not on file    Attends religious service: Not on file    Active member of club or organization: Not on file    Attends meetings of clubs or organizations: Not on file    Relationship status: Not on file  .  Intimate partner violence:    Fear of current or ex partner: Not on file    Emotionally abused: Not on file    Physically abused: Not on file    Forced sexual activity: Not on file  Other Topics Concern  . Not on file  Social History Narrative  . Not on file          Mardella LaymanHagler, Jerimie Mancuso, MD 09/03/18 [phone]

## 2019-01-03 ENCOUNTER — Other Ambulatory Visit: Payer: Self-pay | Admitting: Nurse Practitioner

## 2019-01-03 ENCOUNTER — Other Ambulatory Visit: Payer: Self-pay | Admitting: Family

## 2019-01-03 DIAGNOSIS — Z1231 Encounter for screening mammogram for malignant neoplasm of breast: Secondary | ICD-10-CM

## 2019-02-17 IMAGING — MG DIGITAL SCREENING BILATERAL MAMMOGRAM WITH CAD
7 series · 7 of 7 positions shown · non-contrast
Comparison: Previous exam(s).

CLINICAL DATA: Screening.

EXAM:
DIGITAL SCREENING BILATERAL MAMMOGRAM WITH CAD

[L MLO (1 of 2)]
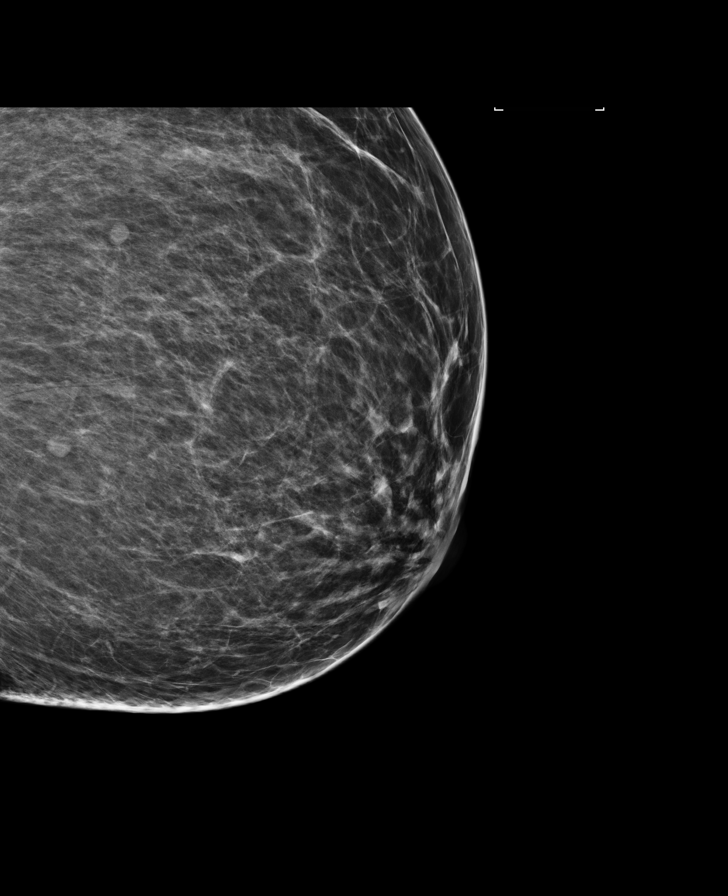

[L MLO (2 of 2)]
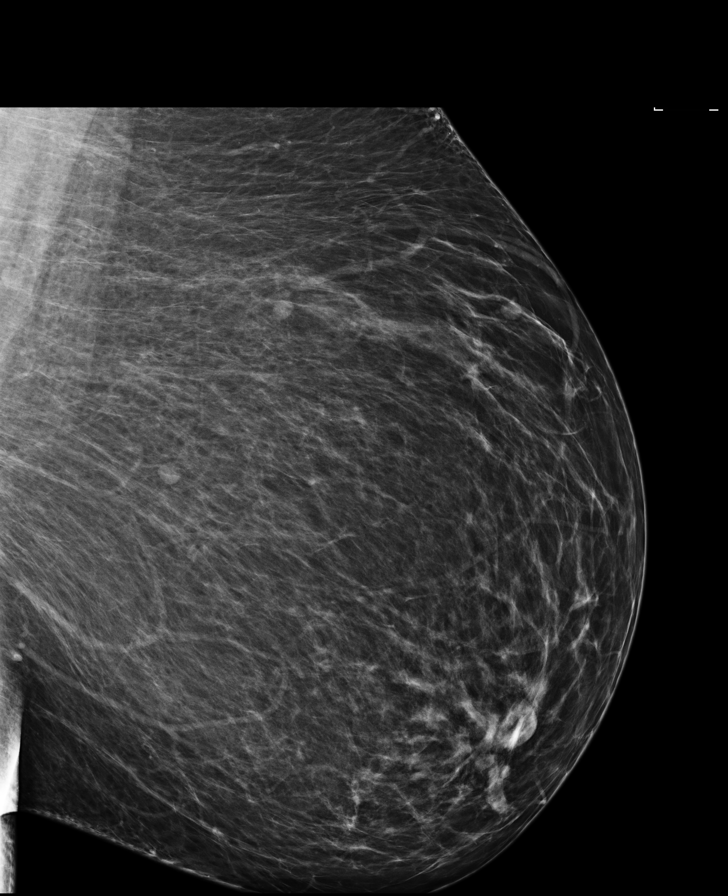

[R CC]
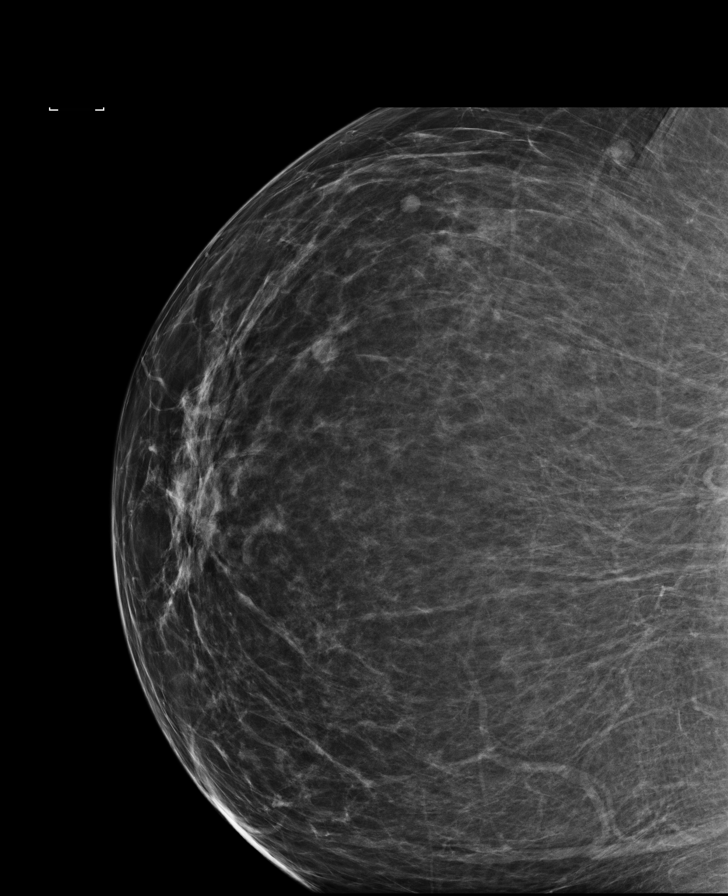

[R CV]
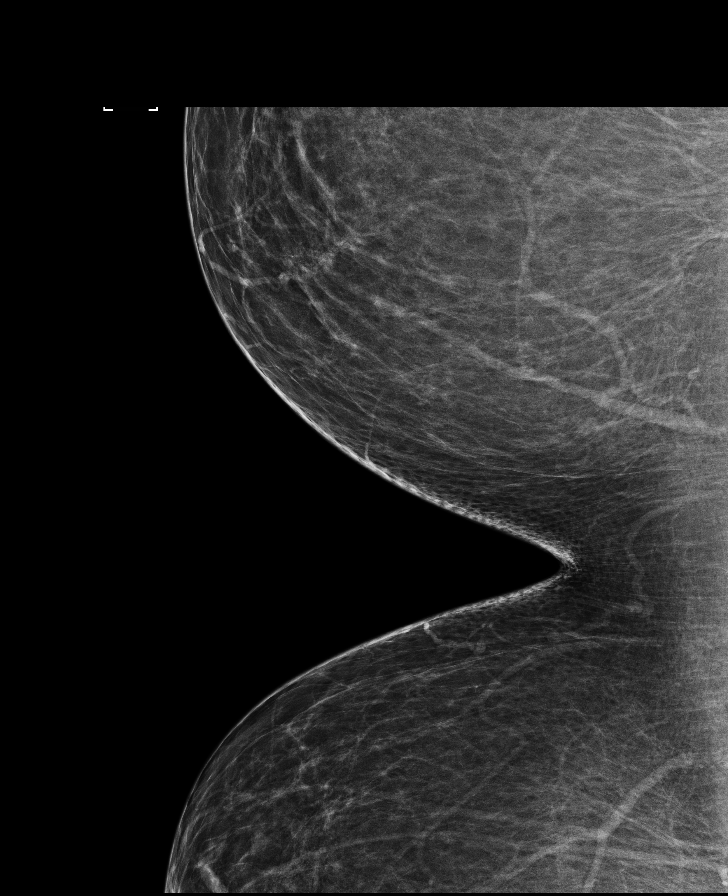

[R MLO (1 of 2)]
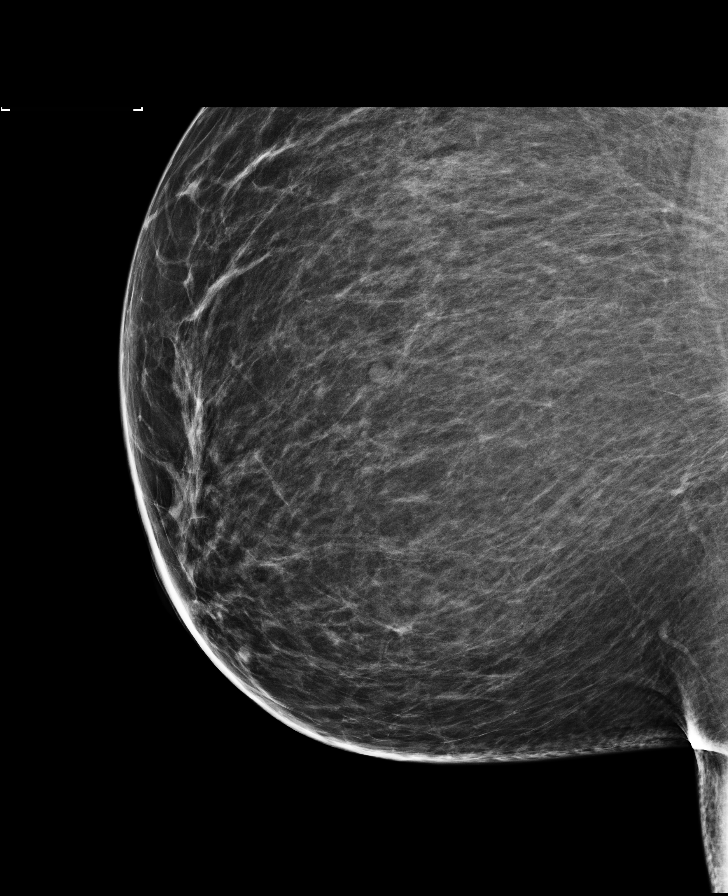

[R MLO (2 of 2)]
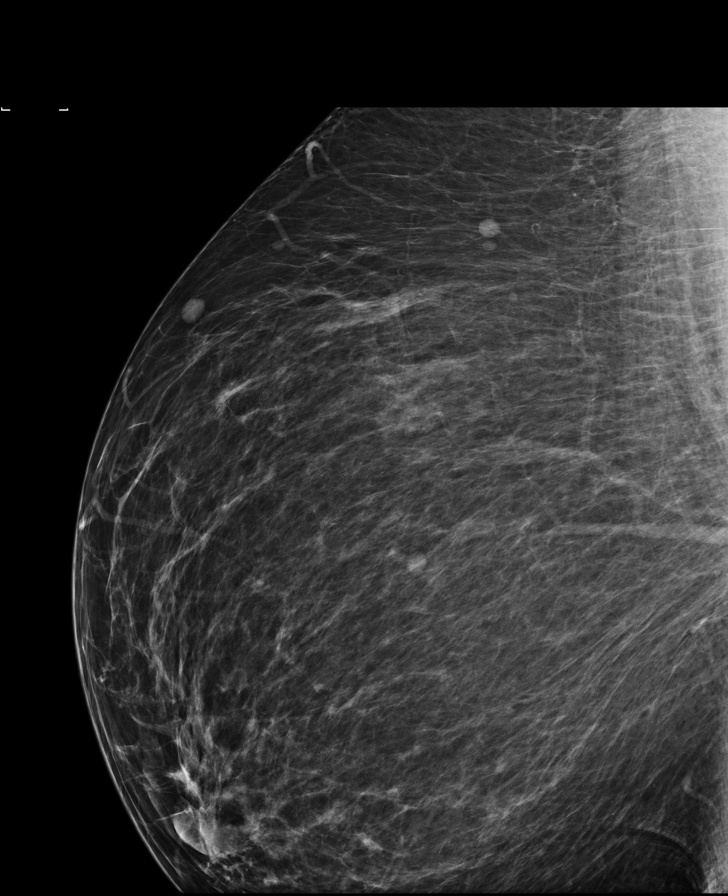

[L CC]
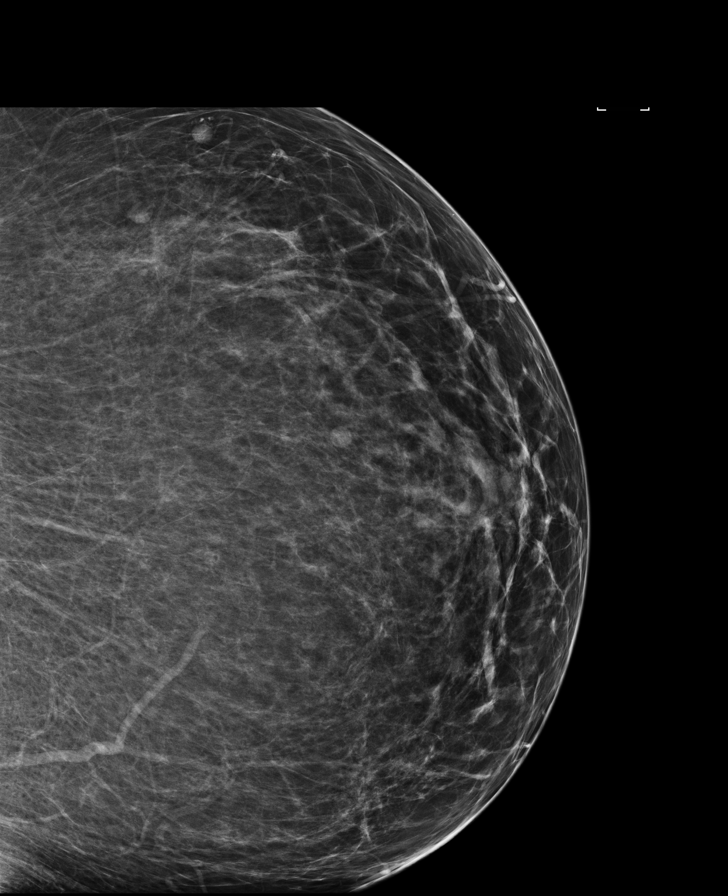

[7 of 7 positions shown; findings below may reference images not displayed]

ACR Breast Density Category b: There are scattered areas of
fibroglandular density.
FINDINGS: There are no findings suspicious for malignancy. Images were
processed with CAD.
IMPRESSION: No mammographic evidence of malignancy. A result letter of this
screening mammogram will be mailed directly to the patient.

RECOMMENDATION:
Screening mammogram in one year. (Code:AS-G-LCT)

BI-RADS CATEGORY  1: Negative.

## 2023-05-11 ENCOUNTER — Encounter: Payer: Self-pay | Admitting: *Deleted

## 2023-10-05 DIAGNOSIS — Z0189 Encounter for other specified special examinations: Secondary | ICD-10-CM | POA: Diagnosis not present

## 2023-11-06 ENCOUNTER — Telehealth: Payer: Self-pay | Admitting: Family Medicine

## 2023-11-06 NOTE — Telephone Encounter (Signed)
 Reached out to the patient to get her schedule no answer, unable to leave a message

## 2024-01-16 ENCOUNTER — Encounter: Payer: Self-pay | Admitting: Obstetrics and Gynecology
